# Patient Record
Sex: Female | Born: 1980 | Race: White | Hispanic: No | Marital: Married | Smoking: Never smoker
Health system: Southern US, Community
[De-identification: ages and names within clinical notes are randomized; demographics above are authoritative.]

## PROBLEM LIST (undated history)

## (undated) DIAGNOSIS — F32A Depression, unspecified: Secondary | ICD-10-CM

## (undated) DIAGNOSIS — F329 Major depressive disorder, single episode, unspecified: Secondary | ICD-10-CM

## (undated) HISTORY — PX: ABDOMINAL HYSTERECTOMY: SHX81

## (undated) HISTORY — DX: Depression, unspecified: F32.A

## (undated) HISTORY — DX: Major depressive disorder, single episode, unspecified: F32.9

---

## 2006-03-20 ENCOUNTER — Encounter: Payer: Self-pay | Admitting: Family Medicine

## 2006-06-20 ENCOUNTER — Ambulatory Visit: Payer: Self-pay | Admitting: Family Medicine

## 2007-08-12 ENCOUNTER — Encounter: Payer: Self-pay | Admitting: Family Medicine

## 2007-08-12 ENCOUNTER — Ambulatory Visit: Payer: Self-pay | Admitting: Family Medicine

## 2007-08-12 DIAGNOSIS — H9190 Unspecified hearing loss, unspecified ear: Secondary | ICD-10-CM | POA: Insufficient documentation

## 2007-09-04 ENCOUNTER — Encounter: Payer: Self-pay | Admitting: Family Medicine

## 2007-10-01 ENCOUNTER — Ambulatory Visit: Payer: Self-pay | Admitting: Family Medicine

## 2008-04-03 ENCOUNTER — Ambulatory Visit: Payer: Self-pay | Admitting: Family Medicine

## 2008-04-03 DIAGNOSIS — J029 Acute pharyngitis, unspecified: Secondary | ICD-10-CM | POA: Insufficient documentation

## 2008-08-17 ENCOUNTER — Ambulatory Visit: Payer: Self-pay | Admitting: Family Medicine

## 2008-08-17 DIAGNOSIS — M542 Cervicalgia: Secondary | ICD-10-CM | POA: Insufficient documentation

## 2008-08-18 ENCOUNTER — Encounter: Payer: Self-pay | Admitting: Family Medicine

## 2008-08-18 LAB — CONVERTED CEMR LAB
Direct LDL: 95 mg/dL
Eosinophils Absolute: 0.1 10*3/uL (ref 0.0–0.7)
Eosinophils Relative: 1 % (ref 0–5)
HCT: 40.1 % (ref 36.0–46.0)
Hemoglobin: 13.4 g/dL (ref 12.0–15.0)
Lymphs Abs: 2.4 10*3/uL (ref 0.7–4.0)
MCV: 93.5 fL (ref 78.0–100.0)
Monocytes Absolute: 0.4 10*3/uL (ref 0.1–1.0)
Monocytes Relative: 7 % (ref 3–12)
RBC: 4.29 M/uL (ref 3.87–5.11)
WBC: 6.3 10*3/uL (ref 4.0–10.5)

## 2009-01-22 ENCOUNTER — Telehealth: Payer: Self-pay | Admitting: Family Medicine

## 2009-04-09 ENCOUNTER — Ambulatory Visit: Payer: Self-pay | Admitting: Family Medicine

## 2009-04-09 ENCOUNTER — Encounter: Admission: RE | Admit: 2009-04-09 | Discharge: 2009-04-09 | Payer: Self-pay | Admitting: Family Medicine

## 2009-04-09 DIAGNOSIS — M25539 Pain in unspecified wrist: Secondary | ICD-10-CM | POA: Insufficient documentation

## 2009-04-21 ENCOUNTER — Encounter: Payer: Self-pay | Admitting: Family Medicine

## 2010-01-14 ENCOUNTER — Ambulatory Visit: Payer: Self-pay | Admitting: Family Medicine

## 2010-01-14 DIAGNOSIS — F4389 Other reactions to severe stress: Secondary | ICD-10-CM | POA: Insufficient documentation

## 2010-01-14 DIAGNOSIS — H9209 Otalgia, unspecified ear: Secondary | ICD-10-CM | POA: Insufficient documentation

## 2010-01-14 DIAGNOSIS — F438 Other reactions to severe stress: Secondary | ICD-10-CM | POA: Insufficient documentation

## 2010-01-16 LAB — CONVERTED CEMR LAB
AST: 14 units/L (ref 0–37)
Albumin: 4.7 g/dL (ref 3.5–5.2)
BUN: 10 mg/dL (ref 6–23)
Calcium: 9.5 mg/dL (ref 8.4–10.5)
Chloride: 102 meq/L (ref 96–112)
Glucose, Bld: 97 mg/dL (ref 70–99)
HDL: 60 mg/dL (ref >39)
Potassium: 4.5 meq/L (ref 3.5–5.3)
TSH: 2.199 microintl units/mL (ref 0.350–4.500)

## 2010-01-20 ENCOUNTER — Ambulatory Visit: Payer: Self-pay | Admitting: Family Medicine

## 2010-01-20 DIAGNOSIS — B373 Candidiasis of vulva and vagina: Secondary | ICD-10-CM | POA: Insufficient documentation

## 2010-01-21 ENCOUNTER — Encounter: Payer: Self-pay | Admitting: Family Medicine

## 2010-01-21 ENCOUNTER — Encounter (INDEPENDENT_AMBULATORY_CARE_PROVIDER_SITE_OTHER): Payer: Self-pay | Admitting: *Deleted

## 2010-01-21 LAB — CONVERTED CEMR LAB
WBC, Wet Prep HPF POC: NONE SEEN
Yeast Wet Prep HPF POC: NONE SEEN

## 2010-02-07 ENCOUNTER — Ambulatory Visit (HOSPITAL_COMMUNITY): Payer: Self-pay | Admitting: Psychology

## 2010-02-25 ENCOUNTER — Ambulatory Visit (HOSPITAL_COMMUNITY): Payer: Self-pay | Admitting: Psychology

## 2010-03-03 ENCOUNTER — Ambulatory Visit (HOSPITAL_COMMUNITY): Payer: Self-pay | Admitting: Psychiatry

## 2010-03-09 ENCOUNTER — Ambulatory Visit (HOSPITAL_COMMUNITY): Payer: Self-pay | Admitting: Psychology

## 2010-03-16 ENCOUNTER — Ambulatory Visit (HOSPITAL_COMMUNITY): Payer: Self-pay | Admitting: Psychology

## 2010-04-22 ENCOUNTER — Telehealth: Payer: Self-pay | Admitting: Family Medicine

## 2010-04-26 ENCOUNTER — Ambulatory Visit: Payer: Self-pay | Admitting: Family

## 2010-12-20 NOTE — Assessment & Plan Note (Signed)
Summary: Vaginitis, anxiety   Vital Signs:  Patient profile:   30 year old female Height:      63.75 inches Weight:      134 pounds Pulse rate:   96 / minute BP sitting:   107 / 67  (left arm) Cuff size:   regular  Vitals Entered By: Kathlene November (January 20, 2010 9:21 AM) CC: on antibiotic- has yeast infection- took OTC Tioconazole Ointment 6.5%- applied once and swollen labia and very painful to even walk   Primary Care Provider:  Nani Gasser MD  CC:  on antibiotic- has yeast infection- took OTC Tioconazole Ointment 6.5%- applied once and swollen labia and very painful to even walk.  History of Present Illness: on antibiotic- has yeast infection- took OTC Tioconazole Ointment 6.5%- applied once and swollen labia and very painful to even walk. Sxs started yesterday.  Sx got worse by this AM. having vaginal burning and swelling and noticing cottage cheese discharge. No alleviating sxs.    Current Medications (verified): 1)  Mirena 20 Mcg/24hr Iud (Levonorgestrel) 2)  Augmentin 875-125 Mg Tabs (Amoxicillin-Pot Clavulanate) .... Take 1 Tablet By Mouth Two Times A Day For 10 Days  Allergies (verified): No Known Drug Allergies  Comments:  Nurse/Medical Assistant: The patient's medications and allergies were reviewed with the patient and were updated in the Medication and Allergy Lists. Kathlene November (January 20, 2010 9:22 AM)  Physical Exam  General:  Well-developed,well-nourished,in no acute distress; alert,appropriate and cooperative throughout examination Genitalia:  Her labia aver very swollen, pin and red with a thick white discharge.  Skin:  no rashes.   Psych:  Cognition and judgment appear intact. Alert and cooperative with normal attention span and concentration. No apparent delusions, illusions, hallucinations   Impression & Recommendations:  Problem # 1:  CANDIDIASIS, VAGINAL (ICD-112.1) Based on exam it is consistant with yeast vag. Will treat with diflucatn.  Stop otc cream for now. REpeat dose at the end of her ABX course in about 4 days.  Her updated medication list for this problem includes:    Fluconazole 150 Mg Tabs (Fluconazole) .Marland Kitchen... Take 1 tablet by mouth once a day  Orders: T-Wet Prep for Christoper Allegra, [facility] [id number])  Problem # 2:  ANXIETY, SITUATIONAL (ICD-308.3) Would like referral for counseling.  Orders: Psychology Referral (Psychology)  Complete Medication List: 1)  Mirena 20 Mcg/24hr Iud (Levonorgestrel) 2)  Fluconazole 150 Mg Tabs (Fluconazole) .... Take 1 tablet by mouth once a day Prescriptions: FLUCONAZOLE 150 MG TABS (FLUCONAZOLE) Take 1 tablet by mouth once a day  #2 x 0   Entered and Authorized by:   Nani Gasser MD   Signed by:   Nani Gasser MD on 01/20/2010   Method used:   [facility]        [phone][facility] (retail)       [address]       [place], [place]  [address]       Ph: [phone] or [phone]       Fax: [phone]   RxID:   [id number]

## 2010-12-20 NOTE — Assessment & Plan Note (Signed)
Summary: RX --/VGJ   Vital Signs:  Patient profile:   30 year old female Height:      63.75 inches Weight:      132 pounds Pulse rate:   65 / minute BP sitting:   110 / 72  (left arm) Cuff size:   regular  Vitals Entered By: Kathlene November (April 26, 2010 1:12 PM) CC: discuss getting on anxiety med, Depression   Primary Care Provider:  Nani Gasser MD  CC:  discuss getting on anxiety med and Depression.  History of Present Illness: [name] is a 30 year old female who presents today with complaint of anxiety. Notes that anxiety usually occurs prior to travel and is accompanied by nausea, diarrhea, and sweats.  She lost her mother 2 years ago, has two small children.  Has an upcoming trip planned to the beach in less than 1 week.  Generally sleeps well.   She reports + panic attacks.  These happen when she has new social situations.  Depression History:      Positive alarm features for depression include significant weight loss and fatigue (loss of energy).  However, she denies insomnia, impaired concentration (indecisiveness), and recurrent thoughts of death or suicide.  Positive alarm features for a manic disorder include abnormally & persistently irritable mood.  She denies distractibility and excessive buying sprees.        Psychosocial stress factors include the recent death of a loved one.  Risk factors for depression include a personal history of alcoholism.         Problems Prior to Update: 1)  Candidiasis, Vaginal  (ICD-112.1) 2)  Anxiety, Situational  (ICD-308.3) 3)  Routine General Medical Exam@health  Care Facl  (ICD-V70.0) 4)  Otalgia  (ICD-388.70) 5)  Wrist Pain  (ICD-719.43) 6)  Neck Pain, Acute  (ICD-723.1) 7)  Screening For Lipoid Disorders  (ICD-V77.91) 8)  Pharyngitis, Acute  (ICD-462) 9)  Loss, Hearing Nos  (ICD-389.9)  Medications Prior to Update: 1)  Mirena 20 Mcg/24hr Iud (Levonorgestrel) 2)  Fluconazole 150 Mg Tabs (Fluconazole) .... Take 1 Tablet By  Mouth Once A Day 3)  Metrogel-Vaginal 0.75 % Gel (Metronidazole) .Marland Kitchen.. 1 Gram Pv Nightly For 5 Nights  Current Medications (verified): 1)  Mirena 20 Mcg/24hr Iud (Levonorgestrel)  Allergies (verified): No Known Drug Allergies  Comments:  Nurse/Medical Assistant: The patient's medications and allergies were reviewed with the patient and were updated in the Medication and Allergy Lists. Kathlene November (April 26, 2010 1:13 PM)  Physical Exam  General:  Well-developed,well-nourished,in no acute distress; alert,appropriate and cooperative throughout examination Psych:  Oriented X3, memory intact for recent and remote, normally interactive, and good eye contact.  Mildly anxious but pleasant female.   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 Will plan trial of zoloft with as needed klonopin which is to be used before travel and social situations which she knows induce panic attacks.  Patient is to continue with therapy.  Patient was educated on SE's of SSRI's as noted in patient instructions. Plan f/u in 1 month. >15 minutes was spent with patient.  Greater than 50% of this time was spent counseling patient on her anxiety.  Complete Medication List: 1)  Mirena 20 Mcg/24hr Iud (Levonorgestrel) 2)  Zoloft 50 Mg Tabs (Sertraline hcl) .... 1/2 tab by mouth daily x 1 week, then increase to  full tablet once daily on week two 3)  Zoloft 50 Mg Tabs (Sertraline hcl) .... One tablet by mouth daily 4)  Klonopin 0.5 Mg Tabs (Clonazepam) .... One tablet by mouth three times daily as needed for severe anxiety  Patient Instructions: 1)  Please start 1/2 tablet one a day for one week, then increase to a full tablet. 2)  It will likely take several weeks before you will notice improvement. 3)  Side effects of this medicine may include  drowsiness or nausea.  If this becomes an issue for you call for further instructions. 4)  Very rarely people may develop suicidal thoughts when taking these types of medicines- should this happen to you, discontinue medication and go directly to the emergency room. 5)  Please arrange a follow up appointment in 1 month  Prescriptions: KLONOPIN 0.5 MG TABS (CLONAZEPAM) one tablet by mouth three times daily as needed for severe anxiety  #30 x 0   Entered and Authorized by:   Lemont Fillers FNP   Signed by:   Lemont Fillers FNP on 04/26/2010   Method used:   Print then Give to Patient   RxID:   [id number] ZOLOFT 50 MG TABS (SERTRALINE HCL) one tablet by mouth daily  #90 x 0   Entered and Authorized by:   Lemont Fillers FNP   Signed by:   Lemont Fillers FNP on 04/26/2010   Method used:   Electronically to        [facility]* (mail-order)             ,          Ph: [phone]       Fax: [phone]   RxID:   [id number] ZOLOFT 50 MG TABS (SERTRALINE HCL) 1/2 tab by mouth daily x 1 week, then increase to full tablet once daily on week two  #30 x 0   Entered and Authorized by:   Lemont Fillers FNP   Signed by:   Lemont Fillers FNP on 04/26/2010   Method used:   [facility] to        [facility] [phone][facility] (retail)       [address]       [place], [place]  [address]       Ph: [phone] or [phone]       Fax: [phone]   RxID:   [id number]

## 2010-12-20 NOTE — Assessment & Plan Note (Signed)
Summary: CPE, anxiety   Vital Signs:  Patient profile:   30 year old female LMP:     12/24/2009 Height:      63.75 inches Weight:      136 pounds BMI:     23.61 Pulse rate:   62 / minute BP sitting:   108 / 57  (left arm) Cuff size:   regular  Vitals Entered By: Kathlene November (January 14, 2010 8:09 AM) CC: CPE no pap LMP (date): 12/24/2009     Enter LMP: 12/24/2009 Last PAP Date 11/25/2009 Last PAP Result Normal   Primary Care Provider:  Nani Gasser MD  CC:  CPE no pap.  History of Present Illness: 1 month of right ear pain and into her neck.  Has been coming and going.  Will flare for a couple of days then resovles. Throat will hurt occ when this happens. Has been using IBU for pain which does help.  Says her right ear and posterior neck will feel hot to touch and then later that day it will start the hurt and be painful. No hearing change or ear drianage.   Current Medications (verified): 1)  Mirena 20 Mcg/24hr Iud (Levonorgestrel)  Allergies (verified): No Known Drug Allergies  Comments:  Nurse/Medical Assistant: The patient's medications and allergies were reviewed with the patient and were updated in the Medication and Allergy Lists. Kathlene November (January 14, 2010 8:10 AM)  Past History:  Past Surgical History: None  Family History: alcoholism, depression,  HTN, stroke, DM, alcohlism-mother,  Hi chol-father, Lung Ca-GF, Pancreatic Ca- GM Sister with DM  Social History: [employer] Estate [employer] f-freelancing.  Assoc degree.  Married to [place] with 2 child.  Never smoked, no EtOH, no drugs, 1-2 caff/day, exercises regularly.  Review of Systems  The patient denies anorexia, fever, weight loss, weight gain, vision loss, decreased hearing, hoarseness, chest pain, syncope, dyspnea on exertion, peripheral edema, prolonged cough, headaches, hemoptysis, abdominal pain, melena, hematochezia, severe indigestion/heartburn, hematuria, incontinence, genital sores,  muscle weakness, suspicious skin lesions, transient blindness, difficulty walking, depression, unusual weight change, abnormal bleeding, enlarged lymph nodes, and breast masses.         Sister seems to think she has anxiety issues.  Would get diarrhea beefore traveling.  Would feel anxious.  will get panickly if has to be in front of other people.    Physical Exam  General:  Well-developed,well-nourished,in no acute distress; alert,appropriate and cooperative throughout examination Head:  Normocephalic and atraumatic without obvious abnormalities. No apparent alopecia or balding. Eyes:  No corneal or conjunctival inflammation noted. EOMI. Perrla.  Ears:  External ear exam shows no significant lesions or deformities.  Otoscopic examination reveals clear canals, tympanic membranes are intact bilaterally without bulging, retraction, inflammation or discharge. Hearing is grossly normal bilaterally. Nose:  External nasal examination shows no deformity or inflammation. Mouth:  Oral mucosa and oropharynx without lesions or exudates.  Teeth in good repair. Neck:  No deformities, masses, or tenderness noted. Chest Wall:  No deformities, masses, or tenderness noted. Lungs:  Normal respiratory effort, chest expands symmetrically. Lungs are clear to auscultation, no crackles or wheezes. Heart:  Normal rate and regular rhythm. S1 and S2 normal without gallop, murmur, click, rub or other extra sounds. Abdomen:  Bowel sounds positive,abdomen soft and non-tender without masses, organomegaly or hernias noted. Msk:  No deformity or scoliosis noted of thoracic or lumbar spine.   Pulses:  R and L carotid,radial,dorsalis pedis and posterior tibial pulses are full and equal bilaterally Extremities:  No clubbing, cyanosis, edema, or deformity noted with normal full range of motion of all joints.   Neurologic:  No cranial nerve deficits noted. Station and gait are normal. DTRs are symmetrical throughout. Sensory, motor  and coordinative functions appear intact. Skin:  no rashes.   Cervical Nodes:  No lymphadenopathy noted Psych:  Cognition and judgment appear intact. Alert and cooperative with normal attention span and concentration. No apparent delusions, illusions, hallucinations   Impression & Recommendations:  Problem # 1:  ROUTINE GENERAL MEDICAL EXAM@HEALTH  CARE FACL (ICD-V70.0) Screening labs.  Continue regular exercise. Encourage daily calcium.  Has had a pap and breast exam wtih her OB/GYN.    Problem # 2:  OTALGIA (ICD-388.70) Unclear etiology at this time but will treat for OM. fi not better in 10 days then let me know and will refer to ENT for further evaluation.  Her updated medication list for this problem includes:    Augmentin 875-125 Mg Tabs (Amoxicillin-pot clavulanate) .Marland Kitchen... Take 1 tablet by mouth two times a day for 10 days  Problem # 3:  ANXIETY, SITUATIONAL (ICD-308.3) Discussed different treatment. Discussed therapy for anxiety vs daily meds for GAD, vs acute as needed tretament such as when traveling vs betablocker for public speaking(when she gets up in front of her church). Asked her to think about these optins.She is not interested in as needed benzos becuse of her mothers additction hx. Her sisters are on zoloft. Asked her to let me know what she would like to do after has time to think about it.  Orders: T-Comprehensive Metabolic Panel [id number]) T-Lipid Profile [id number]) T-TSH [id number])  Complete Medication List: 1)  Mirena 20 Mcg/24hr Iud (Levonorgestrel) 2)  Augmentin 875-125 Mg Tabs (Amoxicillin-pot clavulanate) .... Take 1 tablet by mouth two times a day for 10 days  Patient Instructions: 1)  Take calcium +vitamin D daily.  Prescriptions: AUGMENTIN 875-125 MG TABS (AMOXICILLIN-POT CLAVULANATE) Take 1 tablet by mouth two times a day for 10 days  #20 x 0   Entered and Authorized by:   Nani Gasser MD   Signed by:   Nani Gasser MD on  01/14/2010   Method used:   Print then Give to Patient   RxID:   [id number]   Last PAP:  Done (03/20/2006 2:21:12 PM) PAP Next Due:  Not Indicated

## 2010-12-20 NOTE — Letter (Signed)
Summary: Primary Care Consult Scheduled Letter  [place] [facility]  [address]   [place], [place] [address]   Phone: [phone]  Fax: [phone]      01/21/2010 MRN: [id number]  Deborah Schmitt [address] [place], [place]  [address]    Dear [name],      We have scheduled an appointment for you.  At the recommendation of Dr.METHENEY , we have scheduled you a consult with Elray Buba, [facility] on February 07, 2010 at [facility] .  Their address [phone] [place] [address] ,[place] . The office phone number is [phone].  If this appointment day and time is not convenient for you, please feel free to call the office of the doctor you are being referred to at the number listed above and reschedule the appointment.     It is important for you to keep your scheduled appointments. We are here to make sure you are given good patient care. If you have questions or you have made changes to your appointment, please notify us at  [phone], ask for HELEN.    Thank you,  Patient Care Coordinator [place] [facility]

## 2010-12-20 NOTE — Progress Notes (Signed)
Summary: Anxiety med  Phone Note Call from Patient Call back at Home Phone [phone] Call back at [phone]   Caller: Patient Call For: Annaliyah Willig Summary of Call: Pt states her and Dr. Linford Arnold discussed at last office visit putting her on something for anxiety said they talked about Xanax or a beta blocker?Marland Kitchen [facility] [address]. Pt states doesn't really want something that is going to completely knock her out Initial call taken by: Kathlene November,  April 22, 2010 1:04 PM  Follow-up for Phone Call        The last OV note from March says that she referred her to counseling.  Will need OV to f/u if she wants new start RX meds. Follow-up by: Seymour Bars DO,  April 22, 2010 1:05 PM     Appended Document: Anxiety med Pt notified of above instructions and sent to schedule appointment. KJ LPN

## 2012-03-04 ENCOUNTER — Encounter: Payer: Self-pay | Admitting: *Deleted

## 2012-03-05 ENCOUNTER — Encounter: Payer: Self-pay | Admitting: Family Medicine

## 2012-03-05 ENCOUNTER — Ambulatory Visit (INDEPENDENT_AMBULATORY_CARE_PROVIDER_SITE_OTHER): Payer: BC Managed Care – PPO | Admitting: Family Medicine

## 2012-03-05 VITALS — BP 117/68 | HR 86 | Temp 98.1°F | Ht 65.0 in | Wt 129.0 lb

## 2012-03-05 DIAGNOSIS — F458 Other somatoform disorders: Secondary | ICD-10-CM

## 2012-03-05 DIAGNOSIS — R198 Other specified symptoms and signs involving the digestive system and abdomen: Secondary | ICD-10-CM

## 2012-03-05 DIAGNOSIS — Z23 Encounter for immunization: Secondary | ICD-10-CM

## 2012-03-05 DIAGNOSIS — K219 Gastro-esophageal reflux disease without esophagitis: Secondary | ICD-10-CM

## 2012-03-05 DIAGNOSIS — J309 Allergic rhinitis, unspecified: Secondary | ICD-10-CM

## 2012-03-05 DIAGNOSIS — R09A2 Foreign body sensation, throat: Secondary | ICD-10-CM

## 2012-03-05 DIAGNOSIS — R0989 Other specified symptoms and signs involving the circulatory and respiratory systems: Secondary | ICD-10-CM

## 2012-03-05 MED ORDER — OMEPRAZOLE 40 MG PO CPDR: 40.0000 mg | DELAYED_RELEASE_CAPSULE | Freq: Every day | ORAL | Status: DC

## 2012-03-05 NOTE — Patient Instructions (Signed)
Diet for GERD or PUD Nutrition therapy can help ease the discomfort of gastroesophageal reflux disease (GERD) and peptic ulcer disease (PUD).  HOME CARE INSTRUCTIONS   Eat your meals slowly, in a relaxed setting.   Eat 5 to 6 small meals per day.   If a food causes distress, stop eating it for a period of time.  FOODS TO AVOID  Coffee, regular or decaffeinated.   Cola beverages, regular or low calorie.   Tea, regular or decaffeinated.   Pepper.   Cocoa.   High fat foods, including meats.   Butter, margarine, hydrogenated oil (trans fats).   Peppermint or spearmint (if you have GERD).   Fruits and vegetables if not tolerated.   Alcohol.   Nicotine (smoking or chewing). This is one of the most potent stimulants to acid production in the gastrointestinal tract.   Any food that seems to aggravate your condition.  If you have questions regarding your diet, ask your caregiver or a registered dietitian. TIPS  Lying flat may make symptoms worse. Keep the head of your bed raised 6 to 9 inches (15 to 23 cm) by using a foam wedge or blocks under the legs of the bed.   Do not lay down until 3 hours after eating a meal.   Daily physical activity may help reduce symptoms.  MAKE SURE YOU:   Understand these instructions.   Will watch your condition.   Will get help right away if you are not doing well or get worse.  Document Released: 11/06/2005 Document Revised: 10/26/2011 Document Reviewed: 09/22/2011 ExitCare Patient Information 2012 [employer]. 

## 2012-03-05 NOTE — Progress Notes (Signed)
Subjective:    Patient ID: Deborah Schmitt, female    DOB: 1981/03/11, 31 y.o.   MRN: [id number]  HPI Started a year ago. Says would feel like she was getting sick and her tonsils were swollen but says now wonders if her reflux is causing it. Says will feel like something is stuck in her throat. Can happen aobut 30 min after eating but not while eating.  No Nausea or vomiting. No abdominal pain or diarrhea.  No fevever. No thyroid problems.  Has normal thyroid labs recently.  In the last 2 weeks happening every day sometimes twice a day. Can last up to an hour. Has also had daily HA since pllen started a couple of weeks ago.  Has been taking Goody powders.   Review of Systems BP 117/68  Pulse 86  Temp(Src) 98.1 F (36.7 C) (Other (Comment))  Ht 5\' 5"  (1.651 m)  Wt 129 lb (58.514 kg)  BMI 21.47 kg/m2  SpO2 100%    No Known Allergies  Past Medical History  Diagnosis Date  . Depression     No past surgical history on file.  History   Social History  . Marital Status: Married    Spouse Name: N/A    Number of Children: N/A  . Years of Education: N/A   Occupational History  . Not on file.   Social History Main Topics  . Smoking status: Never Smoker   . Smokeless tobacco: Not on file  . Alcohol Use: No  . Drug Use: No  . Sexually Active:    Other Topics Concern  . Not on file   Social History Narrative  . No narrative on file    Family History  Problem Relation Age of Onset  . Hypertension Mother   . Stroke Mother   . Diabetes Mother   . Alcohol abuse Mother   . Hyperlipidemia Father   . Diabetes Sister   . Cancer Maternal Grandmother     pancreatic  . Cancer Maternal Grandfather     lung    Outpatient Encounter Prescriptions as of 03/05/2012  Medication Sig Dispense Refill  . levonorgestrel (MIRENA) 20 MCG/24HR IUD 1 each by Intrauterine route once.      Marland Kitchen omeprazole (PRILOSEC) 40 MG capsule Take 1 capsule (40 mg total) by mouth daily.  30 capsule  3  .  DISCONTD: clonazePAM (KLONOPIN) 0.5 MG tablet Take 0.5 mg by mouth 3 (three) times daily as needed.      Marland Kitchen DISCONTD: sertraline (ZOLOFT) 50 MG tablet Take 50 mg by mouth daily.            Objective:   Physical Exam  Constitutional: She is oriented to person, place, and time. She appears well-developed and well-nourished.  HENT:  Head: Normocephalic and atraumatic.  Right Ear: External ear normal.  Left Ear: External ear normal.  Nose: Nose normal.  Mouth/Throat: Oropharynx is clear and moist.       TMs and canals are clear.   Eyes: Conjunctivae and EOM are normal. Pupils are equal, round, and reactive to light.  Neck: Neck supple. No thyromegaly present.  Cardiovascular: Normal rate, regular rhythm and normal heart sounds.   Pulmonary/Chest: Effort normal and breath sounds normal. She has no wheezes.  Lymphadenopathy:    She has no cervical adenopathy.  Neurological: She is alert and oriented to person, place, and time.  Skin: Skin is warm and dry.  Psychiatric: She has a normal mood and affect.  Assessment & Plan:  Globus sensation - Likely reflux related. Discussed stopping the goodies powders and discussed dietary changes and start omeprazole 40mg  daily.  Call if not sig better in 2 weeks. Will refer to GI if not improving.    AR- Recommend trial of claritin or zyrtec or Allegra. Consider adding D for her HA and sinus pressure and cna use Tylenol instead of an NSAID if needed for HA. She has no contra indications to decongestant. Her blood pressures well controlled.

## 2012-04-01 ENCOUNTER — Telehealth: Payer: Self-pay | Admitting: *Deleted

## 2012-04-01 DIAGNOSIS — K219 Gastro-esophageal reflux disease without esophagitis: Secondary | ICD-10-CM

## 2012-04-01 NOTE — Telephone Encounter (Signed)
Pt was placed on Prevacid about a month ago. States that it worked in the beginning but it doesn't seem to be working now. Would like to know what else she should do? States she has 2 pills left and if you want her to pick up the next rx and take it. Please advise.

## 2012-04-02 NOTE — Telephone Encounter (Signed)
Pt informed of referral and informed to take prevacid BID. Pt will call when she needs a refill.

## 2012-04-02 NOTE — Telephone Encounter (Signed)
Then I recommend GI referral for further evaluation. For now increase prevacid to bid. Can send new Rx. I will put in order for referral.

## 2012-04-19 ENCOUNTER — Other Ambulatory Visit: Payer: Self-pay | Admitting: *Deleted

## 2012-04-19 MED ORDER — OMEPRAZOLE 40 MG PO CPDR: 40.0000 mg | DELAYED_RELEASE_CAPSULE | Freq: Every day | ORAL | Status: DC

## 2012-04-19 MED ORDER — OMEPRAZOLE 40 MG PO CPDR: 40.0000 mg | DELAYED_RELEASE_CAPSULE | Freq: Two times a day (BID) | ORAL | Status: DC

## 2012-05-02 ENCOUNTER — Encounter: Payer: Self-pay | Admitting: *Deleted

## 2012-05-10 ENCOUNTER — Ambulatory Visit: Payer: BC Managed Care – PPO | Admitting: Family Medicine

## 2012-05-27 ENCOUNTER — Encounter: Payer: Self-pay | Admitting: *Deleted

## 2013-04-18 ENCOUNTER — Ambulatory Visit (INDEPENDENT_AMBULATORY_CARE_PROVIDER_SITE_OTHER): Payer: BC Managed Care – PPO | Admitting: Family Medicine

## 2013-04-18 ENCOUNTER — Encounter: Payer: Self-pay | Admitting: Family Medicine

## 2013-04-18 VITALS — BP 93/56 | HR 76 | Wt 127.0 lb

## 2013-04-18 DIAGNOSIS — R0789 Other chest pain: Secondary | ICD-10-CM

## 2013-04-18 DIAGNOSIS — R071 Chest pain on breathing: Secondary | ICD-10-CM

## 2013-04-18 DIAGNOSIS — R7301 Impaired fasting glucose: Secondary | ICD-10-CM

## 2013-04-18 NOTE — Patient Instructions (Addendum)
Call if not better in one week.  

## 2013-04-18 NOTE — Progress Notes (Signed)
  Subjective:    Patient ID: Deborah Schmitt, female    DOB: 1981-09-11, 32 y.o.   MRN: [id number]  HPI Woke up Monday Am and had diarrhea for 3-4 hours and then resolves. That night when went to bed she had pain in the left side of chest, left shoulder.  Then called her next AM and called and told to start an antacid. Tried Mylanta. Then yesterday was worse and went to [facility].  Told had a heart murmur and then referred to ED here in [place]. Had elevated D-dimer but neg Chest CT. Neg cardiac enzymes.  D/C home and took IBU nad did feel a little better last night.  Last night ws woken up with sharp pains and had to prop herself up. Couldn't lay on her back. Worse when she takes a deep breath.  Pain is 2/10.  Worst was 6-7/10.  No fever or cough or trauma. No nausea. She reports normal range of motion of the left shoulder. She says when it's really hurting in her chest she says it does hold but more sore to move her shoulder but otherwise it doesn't bother her to move her shoulder.  Review of Systems     Objective:   Physical Exam  Constitutional: She is oriented to person, place, and time. She appears well-developed and well-nourished.  HENT:  Head: Normocephalic and atraumatic.  Eyes: Conjunctivae are normal. Pupils are equal, round, and reactive to light.  Neck: Neck supple. No thyromegaly present.  Cardiovascular: Normal rate, regular rhythm and normal heart sounds.   Pulmonary/Chest: Effort normal and breath sounds normal.  Musculoskeletal: She exhibits no edema.  Lymphadenopathy:    She has no cervical adenopathy.  Neurological: She is alert and oriented to person, place, and time.  Skin: Skin is warm and dry.  Psychiatric: She has a normal mood and affect. Her behavior is normal.    Nontender under the axilla or the chest wall. When she points to the areas of discomfort it is just below the left axilla, just lateral to the breast and then it radiates up towards the clavicle and  around the shoulder towards her shoulder blade. She says her actual shoulder doesn't hurt it just feels weak.      Assessment & Plan:  Left-sided chest pain still unclear etiology. I explained to her that at least the bad things are ruled out such as a pulmonary embolism, heart attack, pneumothorax, mass, and infection of the chest. This mostly leaves the muscle skeletal wall which is probably why she does get some relief with ibuprofen. Make sure to take it with food and water and stop immediately if any GI discomfort or upset. If she wants to try the Nexium that she is Re: bought she certainly can. If not providing any relief for help within one to 2 weeks then stop it.  Abnormal glucose - will check A1C + hx of gestational diabetes. A1c is 5.5 which is completely normal. Gave her reassurance.  Emergency department notes were reviewed.. CBC, CMP were normal. CT and chest x-ray were normal.

## 2013-05-07 ENCOUNTER — Encounter: Payer: Self-pay | Admitting: Family Medicine

## 2014-03-27 ENCOUNTER — Ambulatory Visit: Payer: BC Managed Care – PPO | Admitting: Sports Medicine

## 2014-05-07 ENCOUNTER — Encounter: Payer: Self-pay | Admitting: Family Medicine

## 2014-05-07 ENCOUNTER — Ambulatory Visit (INDEPENDENT_AMBULATORY_CARE_PROVIDER_SITE_OTHER): Payer: BC Managed Care – PPO | Admitting: Family Medicine

## 2014-05-07 VITALS — BP 108/68 | HR 76 | Ht 63.6 in | Wt 119.0 lb

## 2014-05-07 DIAGNOSIS — M79609 Pain in unspecified limb: Secondary | ICD-10-CM

## 2014-05-07 DIAGNOSIS — M79662 Pain in left lower leg: Secondary | ICD-10-CM

## 2014-05-07 DIAGNOSIS — R252 Cramp and spasm: Secondary | ICD-10-CM

## 2014-05-07 DIAGNOSIS — M79605 Pain in left leg: Secondary | ICD-10-CM

## 2014-05-07 LAB — CBC
HEMATOCRIT: 41.1 % (ref 36.0–46.0)
HEMOGLOBIN: 14.4 g/dL (ref 12.0–15.0)
MCH: 31.5 pg (ref 26.0–34.0)
MCHC: 35 g/dL (ref 30.0–36.0)
MCV: 89.9 fL (ref 78.0–100.0)
Platelets: 206 10*3/uL (ref 150–400)
RBC: 4.57 MIL/uL (ref 3.87–5.11)
RDW: 12.9 % (ref 11.5–15.5)
WBC: 4.1 10*3/uL (ref 4.0–10.5)

## 2014-05-07 LAB — SEDIMENTATION RATE: SED RATE: 1 mm/h (ref 0–22)

## 2014-05-07 MED ORDER — LEVONORG-ETH ESTRAD TRIPHASIC PO TABS: 1.0000 | ORAL_TABLET | Freq: Every day | ORAL | Status: DC

## 2014-05-07 NOTE — Progress Notes (Signed)
Subjective:    Patient ID: Deborah Schmitt, female    DOB: [birth year], 33 y.o.   MRN: [id number]  HPI Cramping since started birth control last November.  Seh is on Gildess Fe. Cramping is usually at night. She just has to stretch out and eventually will resolve and improve. No known triggers. Feels like hydrated well. No regular exercise. A couple of weeks ago was getting a cold shooting sensation in left leg. Felt cold and tingling.  Even foot feel like pins and needles.  Now feels like a constant pressure in her calf, a most like someone is pressing on it for the last 2 weeks. Feels like going through the middle of the leg. It is not localized to the side back or anterior portion of the thigh or leg. She says it starts at the hip and is always down to her foot. Occasionally she'll notice that her heel is non-. Sometimes it's her arch. She says even right now her heel feels a little numb and tingly. No back pain , or recent injury. She does stand a lot, oso has tried to sit more, but that has not improved her symptoms.     Review of Systems No chest pain or shortness of breath. No fevers chills or sweats. No prior history DVT. She is on birth control.  BP 108/68  Pulse 76  Ht 5' 3.6" (1.615 m)  Wt 119 lb (53.978 kg)  BMI 20.70 kg/m2    No Known Allergies  Past Medical History  Diagnosis Date  . Depression     No past surgical history on file.  History   Social History  . Marital Status: Married    Spouse Name: N/A    Number of Children: N/A  . Years of Education: N/A   Occupational History  . Not on file.   Social History Main Topics  . Smoking status: Never Smoker   . Smokeless tobacco: Not on file  . Alcohol Use: No  . Drug Use: No  . Sexual Activity:    Other Topics Concern  . Not on file   Social History Narrative  . No narrative on file    Family History  Problem Relation Age of Onset  . Hypertension Mother   . Stroke Mother   . Diabetes Mother   .  Alcohol abuse Mother   . Hyperlipidemia Father   . Diabetes Sister   . Cancer Maternal Grandmother     pancreatic  . Cancer Maternal Grandfather     lung    Outpatient Encounter Prescriptions as of 05/07/2014  Medication Sig  . GILDESS FE 1.5/30 1.5-30 MG-MCG tablet   . levonorgestrel-ethinyl estradiol (TRIVORA, 28,) tablet Take 1 tablet by mouth daily.  Marland Kitchen omeprazole (PRILOSEC) 40 MG capsule Take 1 capsule (40 mg total) by mouth 2 (two) times daily.  . [DISCONTINUED] levonorgestrel (MIRENA) 20 MCG/24HR IUD 1 each by Intrauterine route once.          Objective:   Physical Exam  Constitutional: She is oriented to person, place, and time. She appears well-developed and well-nourished.  HENT:  Head: Normocephalic and atraumatic.  Eyes: Conjunctivae are normal.  Cardiovascular: Normal rate.   Musculoskeletal:  Lumbar spine with normal flexion, extension, rotation right and left, side bending. Hip, knee, ankle strength is 5 out of 5 bilaterally. No lower extremity edema. No tenderness over the calf with squeezing.  Neurological: She is alert and oriented to person, place, and time. She has normal  reflexes.  Skin: Skin is warm and dry.  Psychiatric: She has a normal mood and affect. Her behavior is normal.          Assessment & Plan:  Muscle cramping - will check CK, thyroid etc. We'll go ahead and change her birth control to a different type of estrogen component just to see if this makes a difference. She does correlate cramping specifically with taking the birth control.  Left leg pain - the leg pain is diffuse and feels addicted to the center of the leg which is not consistent with a single nerve injury. She's had some significant pressure over the calf muscle that has been persistent for the last 2 weeks. She's not had any pain with squeezing of the calf today but I will go ahead and check a d-dimer but I think this is low risk overall, for DVT. She does have numbness  specifically over the heel the symptoms in the arch which could be related to an injury to S1. There she denies any back pain whatsoever. Consider nerve conduction testing if her blood work is normal.

## 2014-05-07 NOTE — Patient Instructions (Signed)
We can consider nerve conduction study of the left leg if the labs are normal.

## 2014-05-08 ENCOUNTER — Ambulatory Visit (HOSPITAL_BASED_OUTPATIENT_CLINIC_OR_DEPARTMENT_OTHER)
Admission: RE | Admit: 2014-05-08 | Discharge: 2014-05-08 | Disposition: A | Discharge status: Home / Self Care | Payer: BC Managed Care – PPO | Source: Ambulatory Visit | Attending: Family Medicine | Admitting: Family Medicine

## 2014-05-08 ENCOUNTER — Other Ambulatory Visit: Payer: Self-pay | Admitting: Family Medicine

## 2014-05-08 DIAGNOSIS — R7989 Other specified abnormal findings of blood chemistry: Secondary | ICD-10-CM

## 2014-05-08 DIAGNOSIS — M79662 Pain in left lower leg: Secondary | ICD-10-CM

## 2014-05-08 DIAGNOSIS — M79609 Pain in unspecified limb: Secondary | ICD-10-CM | POA: Insufficient documentation

## 2014-05-08 DIAGNOSIS — R791 Abnormal coagulation profile: Secondary | ICD-10-CM | POA: Insufficient documentation

## 2014-05-08 LAB — MAGNESIUM: MAGNESIUM: 2.1 mg/dL (ref 1.5–2.5)

## 2014-05-08 LAB — TSH: TSH: 2.055 u[IU]/mL (ref 0.350–4.500)

## 2014-05-08 LAB — FERRITIN: FERRITIN: 30 ng/mL (ref 10–291)

## 2014-05-08 LAB — D-DIMER, QUANTITATIVE (NOT AT ARMC): D DIMER QUANT: 1.07 ug{FEU}/mL — AB (ref 0.00–0.48)

## 2014-05-08 LAB — VITAMIN B12: Vitamin B-12: 386 pg/mL (ref 211–911)

## 2014-05-08 LAB — CK: CK TOTAL: 65 U/L (ref 7–177)

## 2014-09-04 ENCOUNTER — Other Ambulatory Visit: Payer: Self-pay

## 2015-09-17 ENCOUNTER — Ambulatory Visit (INDEPENDENT_AMBULATORY_CARE_PROVIDER_SITE_OTHER): Payer: BLUE CROSS/BLUE SHIELD | Admitting: Family Medicine

## 2015-09-17 ENCOUNTER — Encounter: Payer: Self-pay | Admitting: Family Medicine

## 2015-09-17 VITALS — BP 118/70 | HR 69 | Temp 98.2°F | Resp 16 | Wt 143.6 lb

## 2015-09-17 DIAGNOSIS — M25511 Pain in right shoulder: Secondary | ICD-10-CM | POA: Diagnosis not present

## 2015-09-17 DIAGNOSIS — R21 Rash and other nonspecific skin eruption: Secondary | ICD-10-CM | POA: Diagnosis not present

## 2015-09-17 MED ORDER — PREDNISONE 10 MG PO TABS: ORAL_TABLET | ORAL | Status: DC

## 2015-09-17 NOTE — Addendum Note (Signed)
Addended by: Teddy Spike on: 09/17/2015 01:50 PM   Modules accepted: Orders

## 2015-09-17 NOTE — Progress Notes (Signed)
   Subjective:    Patient ID: Deborah Schmitt, female    DOB: 26-Aug-1981, 34 y.o.   MRN: [id number]  HPI Noticed rash on face on right side of jaw line about 2 weeks ago. Started using ketoconazole. Says started at a bump and has been getting large. Now looks like a ring.  + very itchy.  Then yesterday woke up and rash is now on neck and forehead and right temple. They are more scaly bumps and itching as well. She did get hair extensions 3 weeks ago. No other new soaps, lotions etc. No hiking or camping. She does have a cat.  She's also had pain in her right shoulder since August. She says she is right-handed and that her dominant arm. She does do a lot of heavy lifting at work. But doesn't remember a specific injury or trauma to the shoulder. She's never had problems before. She says it's painful to raise her arm through about 30-90. When she gets past 90 she is able to reach above her head. She just feels like it's weaker at times. She does take ibuprofen for it when it is really bothering her when she has to work. Painful to sleep on that shoulder at times. No real alleviating factors    Review of Systems     Objective:   Physical Exam  Constitutional: She appears well-developed and well-nourished.  Musculoskeletal:  Right shoulder with normal range of motion. She did have slight weakness on the right with empty can test compared to the left. She also had difficulty with lift-off test and her right shoulder compared to her left shoulder. No significant discomfort with internal or external rotation of the shoulder. Strength is 5 out of 5.  Neurological:  She does have a ring shaped rash on the right jawline with some central clearing. There is a little bit of scale on the surface. She has about 5 more smaller lesions approximately 1 cm in size with no suture clearing on the right side of her neck and a few smaller lesions clustered on her right temple that are slightly raised and pink and some  have a little bit of scale but not all of them. She also has a couple of lesions on the left side of the neck.  Skin: Skin is warm and dry. Rash noted.  Psychiatric: She has a normal mood and affect. Her behavior is normal.          Assessment & Plan:  Rash-the most seems to be more allergic. If she's been using ketoconazole on it for almost 2 weeks and it has actually been getting worse and I doubt that it's fungal. Skin scraping performed and will send for KOH evaluation. Recommend a trial of prednisone. It actually looks very much like poison ivy or contact dermatitis. Call with results once available. If she doesn't improve on the prednisone then please let us know. Next  Right shoulder pain-most consistent with rotator cuff tendinitis versus partial tear. She did have some weakness with empty can test and some weakness with liftoff test of her low back. Given handout on some home stretches and exercises to do. Can use ibuprofen as needed. Call if not at least some improvement over the next 3-4 weeks.

## 2015-09-21 LAB — KOH PREP: RESULT - KOH: NONE SEEN

## 2016-05-16 ENCOUNTER — Encounter: Payer: Self-pay | Admitting: Osteopathic Medicine

## 2016-05-16 ENCOUNTER — Ambulatory Visit (INDEPENDENT_AMBULATORY_CARE_PROVIDER_SITE_OTHER): Payer: BLUE CROSS/BLUE SHIELD | Admitting: Osteopathic Medicine

## 2016-05-16 VITALS — BP 119/78 | HR 65 | Ht 64.0 in | Wt 143.0 lb

## 2016-05-16 DIAGNOSIS — H6981 Other specified disorders of Eustachian tube, right ear: Secondary | ICD-10-CM

## 2016-05-16 DIAGNOSIS — H81311 Aural vertigo, right ear: Secondary | ICD-10-CM | POA: Diagnosis not present

## 2016-05-16 MED ORDER — IPRATROPIUM BROMIDE 0.03 % NA SOLN: 2.0000 | Freq: Two times a day (BID) | NASAL | Status: DC

## 2016-05-16 NOTE — Progress Notes (Signed)
HPI: Deborah Schmitt is a 35 y.o. female who presents to [facility] today for chief complaint of:  Chief Complaint  Patient presents with  . Ear Pain    RIGHT    . Location: right ear  Context: 4 days ago was swimming, diving, hasn't done this in some time. Doesn't remember any water going into her nose or having your problems immediately after swimming. Seen in urgent care for ear pain/dizziness the next day, see below for medications. History of vertigo several years ago. . Quality: room spinning and ear pain on R, ear now is feeling warm and aching . Severity: getting better but not really going away. Seems to be happening intermittently . Modifying factors: Was given prednisone printed Rx and is taking meclizine  . Assoc signs/symptoms:  No fever, chills, headache, vision changes. No presyncope.   Past medical, social and family history reviewed: Past Medical History  Diagnosis Date  . Depression    No past surgical history on file. Social History  Substance Use Topics  . Smoking status: Never Smoker   . Smokeless tobacco: Not on file  . Alcohol Use: No   Family History  Problem Relation Age of Onset  . Hypertension Mother   . Stroke Mother   . Diabetes Mother   . Alcohol abuse Mother   . Hyperlipidemia Father   . Diabetes Sister   . Cancer Maternal Grandmother     pancreatic  . Cancer Maternal Grandfather     lung    Current Outpatient Prescriptions  Medication Sig Dispense Refill  . meclizine (ANTIVERT) 25 MG tablet Take by mouth.     No current facility-administered medications for this visit.   No Known Allergies    Review of Systems: CONSTITUTIONAL:  No  fever, no chills, No recent illness, No unintentional weight changes HEAD/EYES/EARS/NOSE/THROAT: No  headache, no vision change, no hearing change, No sore throat, No  sinus pressure, (+) ear symptoms as per history of present illness CARDIAC: No  chest pain, No   pressure, No palpitations, No  orthopnea RESPIRATORY: No  cough, No  shortness of breath/wheeze GASTROINTESTINAL: No  nausea, No  vomiting, No  abdominal pain NEUROLOGIC: No  weakness, (+) dizziness, No  slurred speech, no headaches, no presyncope   Exam:  BP 119/78 mmHg  Pulse 65  Ht 5\' 4"  (1.626 m)  Wt 143 lb (64.864 kg)  BMI 24.53 kg/m2 Constitutional: VS see above. General Appearance: alert, well-developed, well-nourished, NAD Eyes: Normal lids and conjunctive, non-icteric sclera, PERRLA, EOMI Ears, Nose, Mouth, Throat: MMM, Normal external inspection ears/nares/mouth/lips/gums, TM normal bilaterally, scant clear effusion behind right ear. Pharynx/tonsils no erythema, no exudate. Nasal mucosa normal.                                                    Neck: No masses, trachea midline. No thyroid enlargement. No tenderness/mass appreciated. No lymphadenopathy, normal range of motion to neck Respiratory: Normal respiratory effort. no wheeze, no rhonchi, no rales Cardiovascular: S1/S2 normal, no murmur, no rub/gallop auscultated. RRR. No lower extremity edema. Neurological: No cranial nerve deficit on limited exam. Motor and sensation intact and symmetric. Cerebellar reflexes intact. Hallpike maneuver negative bilaterally. Gait ormal Skin: warm, dry, intact. No rash/ulcer. No concerning nevi or subq nodules on limited exam.   Psychiatric: Normal judgment/insight.  Normal mood and affect. Oriented x3.      ASSESSMENT/PLAN: Most likely vertigo, could've been exacerbated by unusual positioning/activity during swimming and diving. Dix Hallpike maneuver was negative, patient states symptoms are overall better but haven't quite resolved, continue to monitor, treat as below, will add ipratropium to hopefully clear her nasal passages if eustachian tube dysfunction is playing a role here. If not getting better, would consider labs, no referral, MRI / follow-up with PCP  Eustachian tube dysfunction,  right - Plan: ipratropium (ATROVENT) 0.03 % nasal spray  Vertigo, aural, right    All questions were answered. Visit summary with medication list and pertinent instructions was printed for patient to review. ER/RTC precautions were reviewed with the patient. Return if symptoms worsen or fail to improve.

## 2016-05-16 NOTE — Patient Instructions (Signed)
Barotitis Media Barotitis media is inflammation of your middle ear. This occurs when the auditory tube (eustachian tube) leading from the back of your nose (nasopharynx) to your eardrum is blocked. This blockage may result from a cold, environmental allergies, or an upper respiratory infection, or other causes of inflammation in the sinuses.  HOME CARE INSTRUCTIONS   Use medicines as recommended by your health care provider. Over-the-counter antihistamines will help unblock the canal and can help during times of air travel.  Do not put anything into your ears to clean or unplug them. Eardrops will not be helpful.  Do not swim, dive, or fly until your health care provider says it is all right to do so. If these activities are necessary, chewing gum with frequent, forceful swallowing may help. It is also helpful to hold your nose and gently blow to pop your ears for equalizing pressure changes. This forces air into the eustachian tube.  Only take over-the-counter or prescription medicines for pain, discomfort, or fever as directed by your health care provider.  A decongestant or an antihistamine nasal spray may be helpful in decongesting the middle ear and make pressure equalization easier. SEEK MEDICAL CARE IF:  You experience a serious form of dizziness in which you feel as if the room is spinning and you feel nauseated (vertigo).  Your symptoms only involve one ear. SEEK IMMEDIATE MEDICAL CARE IF:   You develop a severe headache, dizziness, or severe ear pain.  You have bloody or pus-like drainage from your ears.  You develop a fever.  Your problems do not improve within 7 days or if they become worse. MAKE SURE YOU:   Understand these instructions.  Will watch your condition.  Will get help right away if you are not doing well or get worse.   This information is not intended to replace advice given to you by your health care provider. Make sure you discuss any questions you have  with your health care provider.   Document Released: 11/03/2000 Document Revised: 08/27/2013 Document Reviewed: 06/03/2013 Elsevier Interactive Patient Education [employer].

## 2016-11-28 DIAGNOSIS — N83209 Unspecified ovarian cyst, unspecified side: Secondary | ICD-10-CM | POA: Insufficient documentation

## 2016-11-28 HISTORY — PX: TOTAL VAGINAL HYSTERECTOMY: SHX2548

## 2016-11-30 DIAGNOSIS — Z124 Encounter for screening for malignant neoplasm of cervix: Secondary | ICD-10-CM | POA: Insufficient documentation

## 2016-12-15 ENCOUNTER — Ambulatory Visit: Payer: BLUE CROSS/BLUE SHIELD | Admitting: Family Medicine

## 2017-09-20 DIAGNOSIS — R109 Unspecified abdominal pain: Secondary | ICD-10-CM | POA: Diagnosis not present

## 2017-09-20 DIAGNOSIS — Z90710 Acquired absence of both cervix and uterus: Secondary | ICD-10-CM | POA: Diagnosis not present

## 2017-10-02 DIAGNOSIS — R109 Unspecified abdominal pain: Secondary | ICD-10-CM | POA: Diagnosis not present

## 2018-01-02 DIAGNOSIS — Z23 Encounter for immunization: Secondary | ICD-10-CM | POA: Diagnosis not present

## 2018-01-02 DIAGNOSIS — J0100 Acute maxillary sinusitis, unspecified: Secondary | ICD-10-CM | POA: Diagnosis not present

## 2018-03-28 DIAGNOSIS — J328 Other chronic sinusitis: Secondary | ICD-10-CM | POA: Diagnosis not present

## 2018-03-28 DIAGNOSIS — H938X3 Other specified disorders of ear, bilateral: Secondary | ICD-10-CM | POA: Diagnosis not present

## 2018-03-28 DIAGNOSIS — R42 Dizziness and giddiness: Secondary | ICD-10-CM | POA: Diagnosis not present

## 2018-03-28 DIAGNOSIS — H9041 Sensorineural hearing loss, unilateral, right ear, with unrestricted hearing on the contralateral side: Secondary | ICD-10-CM | POA: Diagnosis not present

## 2018-03-28 DIAGNOSIS — R51 Headache: Secondary | ICD-10-CM | POA: Diagnosis not present

## 2018-03-28 DIAGNOSIS — H93293 Other abnormal auditory perceptions, bilateral: Secondary | ICD-10-CM | POA: Diagnosis not present

## 2018-04-02 DIAGNOSIS — G43709 Chronic migraine without aura, not intractable, without status migrainosus: Secondary | ICD-10-CM | POA: Diagnosis not present

## 2018-05-07 DIAGNOSIS — Z6825 Body mass index (BMI) 25.0-25.9, adult: Secondary | ICD-10-CM | POA: Diagnosis not present

## 2018-05-07 DIAGNOSIS — Z01419 Encounter for gynecological examination (general) (routine) without abnormal findings: Secondary | ICD-10-CM | POA: Diagnosis not present

## 2018-05-07 DIAGNOSIS — Z90710 Acquired absence of both cervix and uterus: Secondary | ICD-10-CM | POA: Diagnosis not present

## 2018-05-29 DIAGNOSIS — G43709 Chronic migraine without aura, not intractable, without status migrainosus: Secondary | ICD-10-CM | POA: Insufficient documentation

## 2018-08-13 ENCOUNTER — Encounter: Payer: Self-pay | Admitting: Sports Medicine

## 2018-08-13 ENCOUNTER — Ambulatory Visit (INDEPENDENT_AMBULATORY_CARE_PROVIDER_SITE_OTHER): Payer: BLUE CROSS/BLUE SHIELD

## 2018-08-13 ENCOUNTER — Ambulatory Visit (INDEPENDENT_AMBULATORY_CARE_PROVIDER_SITE_OTHER): Payer: BLUE CROSS/BLUE SHIELD | Admitting: Sports Medicine

## 2018-08-13 DIAGNOSIS — R200 Anesthesia of skin: Secondary | ICD-10-CM | POA: Diagnosis not present

## 2018-08-13 DIAGNOSIS — M25561 Pain in right knee: Secondary | ICD-10-CM | POA: Diagnosis not present

## 2018-08-13 DIAGNOSIS — M545 Low back pain: Secondary | ICD-10-CM

## 2018-08-13 DIAGNOSIS — M2550 Pain in unspecified joint: Secondary | ICD-10-CM | POA: Diagnosis not present

## 2018-08-13 DIAGNOSIS — G5603 Carpal tunnel syndrome, bilateral upper limbs: Secondary | ICD-10-CM | POA: Diagnosis not present

## 2018-08-13 DIAGNOSIS — M79644 Pain in right finger(s): Secondary | ICD-10-CM

## 2018-08-13 DIAGNOSIS — M79604 Pain in right leg: Secondary | ICD-10-CM

## 2018-08-13 DIAGNOSIS — M25562 Pain in left knee: Secondary | ICD-10-CM | POA: Diagnosis not present

## 2018-08-13 DIAGNOSIS — M79642 Pain in left hand: Secondary | ICD-10-CM | POA: Diagnosis not present

## 2018-08-13 DIAGNOSIS — G8929 Other chronic pain: Secondary | ICD-10-CM | POA: Diagnosis not present

## 2018-08-13 MED ORDER — PREDNISONE 50 MG PO TABS: ORAL_TABLET | ORAL | 0 refills | Status: DC

## 2018-08-13 NOTE — Progress Notes (Addendum)
Subjective:    I'm seeing this patient as a consultation for: Dr. 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  Bilateral hand pain: With numbness and tingling into the middle fingers bilaterally, worse at night and in the morning.  Occasional cramping, symptoms are moderate, persistent, no trauma.  Right leg pain: Radiates from her back, down the anterior right knee, to the great toe.  Occasional sensations of swelling in the right knee, swelling in the great toe that come intermittently.  Worse with sitting, flexion, Valsalva.  No bowel or bladder dysfunction, saddle numbness, constitutional symptoms.  Occasionally occurs in the contralateral side as well, moderate to severe morning stiffness.  I reviewed the past medical history, family history, social history, surgical history, and allergies today and no changes were needed.  Please see the problem list section below in epic for further details.  Past Medical History: Past Medical History:  Diagnosis Date  . Depression    Past Surgical History: Past Surgical History:  Procedure Laterality Date  . ABDOMINAL HYSTERECTOMY     Social History: Social History   Socioeconomic History  . Marital status: Married    Spouse name: Not on file  . Number of children: Not on file  . Years of education: Not on file  . Highest education level: Not on file  Occupational History  . Not on file  Social Needs  . Financial resource strain: Not on file  . Food insecurity:    Worry: Not on file    Inability: Not on file  . Transportation needs:    Medical: Not on file    Non-medical: Not on file  Tobacco Use  . Smoking status: Never Smoker  . Smokeless tobacco: Never Used  Substance and Sexual Activity  . Alcohol use: No  . Drug use: No  . Sexual activity: Not  on file  Lifestyle  . Physical activity:    Days per week: Not on file    Minutes per session: Not on file  . Stress: Not on file  Relationships  . Social connections:    Talks on phone: Not on file    Gets together: Not on file    Attends religious service: Not on file    Active member of club or organization: Not on file    Attends meetings of clubs or organizations: Not on file    Relationship status: Not on file  Other Topics Concern  . Not on file  Social History Narrative  . Not on file   Family History: Family History  Problem Relation Age of Onset  . Hypertension Mother   . Stroke Mother   . Diabetes Mother   . Alcohol abuse Mother   . Hyperlipidemia Father   . Diabetes Sister   . Cancer Maternal Grandmother        pancreatic  . Cancer Maternal Grandfather        lung   Allergies: No Known Allergies Medications: See med rec.  Review of Systems: No headache, visual changes, nausea, vomiting, diarrhea, constipation, dizziness, abdominal pain, skin rash, fevers, chills, night sweats, weight loss, swollen lymph nodes, body aches, joint swelling, muscle aches, chest pain, shortness of breath, mood changes, visual or auditory hallucinations.   Objective:   General: Well Developed, well nourished, and in no acute distress.  Neuro:  Extra-ocular muscles  intact, able to move all 4 extremities, sensation grossly intact.  Deep tendon reflexes tested were normal. Psych: Alert and oriented, mood congruent with affect. ENT:  Ears and nose appear unremarkable.  Hearing grossly normal. Neck: Unremarkable overall appearance, trachea midline.  No visible thyroid enlargement. Eyes: Conjunctivae and lids appear unremarkable.  Pupils equal and round. Skin: Warm and dry, no rashes noted.  Cardiovascular: Pulses palpable, no extremity edema. Bilateral wrists: Inspection normal with no visible erythema or swelling. ROM smooth and normal with good flexion and extension and  ulnar/radial deviation that is symmetrical with opposite wrist. Palpation is normal over metacarpals, navicular, lunate, and TFCC; tendons without tenderness/ swelling No snuffbox tenderness. No tenderness over Canal of Gu[place]. Strength 5/5 in all directions without pain. Negative bilateral Tinel sign but a positive bilateral Phalen's sign. Negative Finkelstein sign. Negative Watson's test. No wrist, MCP, PIP or DIP synovitis. Back Exam:  Inspection: Unremarkable  Motion: Flexion 45 deg, Extension 45 deg, Side Bending to 45 deg bilaterally,  Rotation to 45 deg bilaterally  SLR laying: Negative  XSLR laying: Negative  Palpable tenderness: None. FABER: negative. Sensory change: Gross sensation intact to all lumbar and sacral dermatomes.  Reflexes: 2+ at both patellar tendons, 2+ at achilles tendons, Babinski's downgoing.  Strength at foot  Plantar-flexion: 5/5 Dorsi-flexion: 5/5 Eversion: 5/5 Inversion: 5/5  Leg strength  Quad: 5/5 Hamstring: 5/5 Hip flexor: 5/5 Hip abductors: 5/5  Gait unremarkable. Right Knee: Normal to inspection with no erythema or effusion or obvious bony abnormalities. Palpation normal with no warmth or joint line tenderness or patellar tenderness or condyle tenderness. ROM normal in flexion and extension and lower leg rotation. Ligaments with solid consistent endpoints including ACL, PCL, LCL, MCL. Negative Mcmurray's and provocative meniscal tests. Non painful patellar compression. Patellar and quadriceps tendons unremarkable. Hamstring and quadriceps strength is normal. Right Foot: No visible erythema or swelling. Range of motion is full in all directions. Strength is 5/5 in all directions. No hallux valgus. No pes cavus or pes planus. No abnormal callus noted. No pain over the navicular prominence, or base of fifth metatarsal. No tenderness to palpation of the calcaneal insertion of plantar fascia. No pain at the Achilles insertion. No pain over the  calcaneal bursa. No pain of the retrocalcaneal bursa. No tenderness to palpation over the tarsals, metatarsals, or phalanges. No hallux rigidus or limitus. No tenderness palpation over interphalangeal joints. No pain with compression of the metatarsal heads. Neurovascularly intact distally.  Impression and Recommendations:   This case required medical decision making of moderate complexity.  Right leg pain The differential is fairly broad, primary knee processes, foot processes but also L4 radiculitis are all possibilities. Adding x-rays of the knee, lumbar spine. Checking some routine labs. Adding 5 days of prednisone.  Polyarthralgia Significant morning stiffness, polyarthralgia, occasional episodes of swelling in the knee and the great toe, question gout, rheumatoid arthritis. Adding a full rheumatoid work-up. Because of this we do need bilateral hand x-rays to look for the classic periarticular erosions.  Autoimmune workup is normal with the exception of rheumatoid factor, its mildly elevated.  CCP is more specific and is negative.  This means she likely does NOT have rheumatoid arthritis but certainly has some autoimmune disease, which explains her dramatic response to prednisone.  She likely falls in the gray area between normal and true autoimmune disease.  I still think we need the neurology second opinion and I do suspect we may end up using prednisone more often, may even  put her on a low dose daily prednisone with appropriate monitoring.  Carpal tunnel syndrome, bilateral Classic carpal tunnel symptoms with a bilateral positive Phalen's sign. She will do nighttime splinting. Return to see me in 1 month for this, bilateral median nerve hydrodissection under ultrasound guidance if no better. ___________________________________________ Gwen Her. Dianah Field, M.D., ABFM., CAQSM. Primary Care and [place] Instructor of [place] of [facility]

## 2018-08-13 NOTE — Assessment & Plan Note (Signed)
Classic carpal tunnel symptoms with a bilateral positive Phalen's sign. She will do nighttime splinting. Return to see me in 1 month for this, bilateral median nerve hydrodissection under ultrasound guidance if no better.

## 2018-08-13 NOTE — Assessment & Plan Note (Signed)
The differential is fairly broad, primary knee processes, foot processes but also L4 radiculitis are all possibilities. Adding x-rays of the knee, lumbar spine. Checking some routine labs. Adding 5 days of prednisone.

## 2018-08-13 NOTE — Assessment & Plan Note (Addendum)
Significant morning stiffness, polyarthralgia, occasional episodes of swelling in the knee and the great toe, question gout, rheumatoid arthritis. Adding a full rheumatoid work-up. Because of this we do need bilateral hand x-rays to look for the classic periarticular erosions.  Autoimmune workup is normal with the exception of rheumatoid factor, its mildly elevated.  CCP is more specific and is negative.  This means she likely does NOT have rheumatoid arthritis but certainly has some autoimmune disease, which explains her dramatic response to prednisone.  She likely falls in the gray area between normal and true autoimmune disease.  I still think we need the neurology second opinion and I do suspect we may end up using prednisone more often, may even put her on a low dose daily prednisone with appropriate monitoring.

## 2018-08-18 LAB — ANA, IFA COMPREHENSIVE PANEL
Anti Nuclear Antibody(ANA): NEGATIVE
ENA SM Ab Ser-aCnc: <1 AI
SM/RNP: <1 AI
SSA (Ro) (ENA) Antibody, IgG: <1 AI
SSB (La) (ENA) Antibody, IgG: <1 AI
Scleroderma (Scl-70) (ENA) Antibody, IgG: <1 AI
ds DNA Ab: 1 [IU]/mL

## 2018-08-18 LAB — COMPREHENSIVE METABOLIC PANEL
AG Ratio: 1.4 (calc) (ref 1.0–2.5)
ALT: 11 U/L (ref 6–29)
Albumin: 4.5 g/dL (ref 3.6–5.1)
CO2: 27 mmol/L (ref 20–32)
Calcium: 9.6 mg/dL (ref 8.6–10.2)
Chloride: 103 mmol/L (ref 98–110)
Creat: 0.72 mg/dL (ref 0.50–1.10)
Globulin: 3.2 g/dL (calc) (ref 1.9–3.7)
Glucose, Bld: 95 mg/dL (ref 65–139)
Sodium: 139 mmol/L (ref 135–146)
Total Bilirubin: 0.5 mg/dL (ref 0.2–1.2)
Total Protein: 7.7 g/dL (ref 6.1–8.1)

## 2018-08-18 LAB — CBC WITH DIFFERENTIAL/PLATELET
Basophils Absolute: 20 cells/uL (ref 0–200)
Basophils Relative: 0.3 %
Eosinophils Absolute: 39 {cells}/uL (ref 15–500)
Eosinophils Relative: 0.6 %
HCT: 40.1 % (ref 35.0–45.0)
Hemoglobin: 13.9 g/dL (ref 11.7–15.5)
Lymphs Abs: 2113 cells/uL (ref 850–3900)
MCH: 31.6 pg (ref 27.0–33.0)
MCHC: 34.7 g/dL (ref 32.0–36.0)
MCV: 91.1 fL (ref 80.0–100.0)
MPV: 13.4 fL — ABNORMAL HIGH (ref 7.5–12.5)
Monocytes Relative: 7.2 %
Neutro Abs: 3861 cells/uL (ref 1500–7800)
Neutrophils Relative %: 59.4 %
Platelets: 177 10*3/uL (ref 140–400)
RBC: 4.4 10*6/uL (ref 3.80–5.10)
RDW: 11.8 % (ref 11.0–15.0)
Total Lymphocyte: 32.5 %
WBC mixed population: 468 {cells}/uL (ref 200–950)
WBC: 6.5 10*3/uL (ref 3.8–10.8)

## 2018-08-18 LAB — CK: Total CK: 63 U/L (ref 29–143)

## 2018-08-18 LAB — COMPREHENSIVE METABOLIC PANEL WITH GFR
AST: 14 U/L (ref 10–30)
Alkaline phosphatase (APISO): 90 U/L (ref 33–115)
BUN: 9 mg/dL (ref 7–25)
Potassium: 3.6 mmol/L (ref 3.5–5.3)

## 2018-08-18 LAB — RHEUMATOID ARTHRITIS DIAGNOSTIC PANEL, COMPREHENSIVE
Cyclic Citrullin Peptide Ab: <16 Units (ref <20)
Rheumatoid Factor (IgA): <5 U (ref <=6)
Rheumatoid Factor (IgG): 20 U — ABNORMAL HIGH (ref <=6)
Rheumatoid Factor (IgM): 38 U — ABNORMAL HIGH (ref <=6)
SSA (Ro) (ENA) Antibody, IgG: <1 AI
SSB (La) (ENA) Antibody, IgG: <1 AI

## 2018-08-18 LAB — URIC ACID: Uric Acid, Serum: 4.2 mg/dL (ref 2.5–7.0)

## 2018-08-18 LAB — SEDIMENTATION RATE: Sed Rate: 11 mm/h (ref 0–20)

## 2018-08-18 LAB — HLA-B27 ANTIGEN: HLA-B27 Antigen: NEGATIVE

## 2018-08-26 DIAGNOSIS — G43709 Chronic migraine without aura, not intractable, without status migrainosus: Secondary | ICD-10-CM | POA: Diagnosis not present

## 2018-08-27 ENCOUNTER — Ambulatory Visit (INDEPENDENT_AMBULATORY_CARE_PROVIDER_SITE_OTHER): Payer: BLUE CROSS/BLUE SHIELD | Admitting: Sports Medicine

## 2018-08-27 ENCOUNTER — Encounter: Payer: Self-pay | Admitting: Sports Medicine

## 2018-08-27 DIAGNOSIS — E663 Overweight: Secondary | ICD-10-CM | POA: Diagnosis not present

## 2018-08-27 DIAGNOSIS — M79604 Pain in right leg: Secondary | ICD-10-CM

## 2018-08-27 DIAGNOSIS — M2550 Pain in unspecified joint: Secondary | ICD-10-CM

## 2018-08-27 DIAGNOSIS — G5603 Carpal tunnel syndrome, bilateral upper limbs: Secondary | ICD-10-CM

## 2018-08-27 MED ORDER — PHENTERMINE HCL 37.5 MG PO TABS: ORAL_TABLET | ORAL | 0 refills | Status: DC

## 2018-08-27 NOTE — Assessment & Plan Note (Signed)
Starting phentermine, return in 1 month. With BMI drops below 24 we can write a letter so that she can get the insurance discount.

## 2018-08-27 NOTE — Progress Notes (Signed)
Subjective:    CC: Follow-up  HPI: Polyarthralgia: Rheumatoid testing with a positive rheumatoid factor but negative CCP and other labs.  Prednisone helped significantly.  Right leg pain: Suspected L4 radiculitis, improved with prednisone.  X-rays did show degenerative changes.  Carpal tunnel syndrome: Improving after 2 weeks of night splinting.  I reviewed the past medical history, family history, social history, surgical history, and allergies today and no changes were needed.  Please see the problem list section below in epic for further details.  Past Medical History: Past Medical History:  Diagnosis Date  . Depression    Past Surgical History: Past Surgical History:  Procedure Laterality Date  . ABDOMINAL HYSTERECTOMY     Social History: Social History   Socioeconomic History  . Marital status: Married    Spouse name: Not on file  . Number of children: Not on file  . Years of education: Not on file  . Highest education level: Not on file  Occupational History  . Not on file  Social Needs  . Financial resource strain: Not on file  . Food insecurity:    Worry: Not on file    Inability: Not on file  . Transportation needs:    Medical: Not on file    Non-medical: Not on file  Tobacco Use  . Smoking status: Never Smoker  . Smokeless tobacco: Never Used  Substance and Sexual Activity  . Alcohol use: No  . Drug use: No  . Sexual activity: Not on file  Lifestyle  . Physical activity:    Days per week: Not on file    Minutes per session: Not on file  . Stress: Not on file  Relationships  . Social connections:    Talks on phone: Not on file    Gets together: Not on file    Attends religious service: Not on file    Active member of club or organization: Not on file    Attends meetings of clubs or organizations: Not on file    Relationship status: Not on file  Other Topics Concern  . Not on file  Social History Narrative  . Not on file   Family  History: Family History  Problem Relation Age of Onset  . Hypertension Mother   . Stroke Mother   . Diabetes Mother   . Alcohol abuse Mother   . Hyperlipidemia Father   . Diabetes Sister   . Cancer Maternal Grandmother        pancreatic  . Cancer Maternal Grandfather        lung   Allergies: No Known Allergies Medications: See med rec.  Review of Systems: No fevers, chills, night sweats, weight loss, chest pain, or shortness of breath.   Objective:    General: Well Developed, well nourished, and in no acute distress.  Neuro: Alert and oriented x3, extra-ocular muscles intact, sensation grossly intact.  HEENT: Normocephalic, atraumatic, pupils equal round reactive to light, neck supple, no masses, no lymphadenopathy, thyroid nonpalpable.  Skin: Warm and dry, no rashes. Cardiac: Regular rate and rhythm, no murmurs rubs or gallops, no lower extremity edema.  Respiratory: Clear to auscultation bilaterally. Not using accessory muscles, speaking in full sentences.  Impression and Recommendations:    Overweight (BMI 25.0-29.9) Starting phentermine, return in 1 month. With BMI drops below 24 we can write a letter so that she can get the insurance discount.  Carpal tunnel syndrome, bilateral Slight improvement with nighttime splinting, we do need to replace the right-sided wrist  brace. Continue this for 1 to 2 months before considering median nerve hydrodissection under ultrasound guidance.  Polyarthralgia Elevated rheumatoid factor but normal CCP, all of the rheumatoid testing is negative. This likely puts her in the gray area between normal and rheumatoid arthritis. Continue ibuprofen 800mg   3 times daily as needed, we will hold off on rheumatoid referral for now.  Right leg pain Likely L4 radiculitis, x-rays of the lumbar spine did show some degenerative changes at the L5-S1 level. Improved considerably with prednisone. I am going to add some specific home rehabilitation  exercises. ___________________________________________ Gwen Her. Dianah Field, M.D., ABFM., CAQSM. Primary Care and [place] Instructor of [place] of [facility] of [facility]

## 2018-08-27 NOTE — Assessment & Plan Note (Signed)
Likely L4 radiculitis, x-rays of the lumbar spine did show some degenerative changes at the L5-S1 level. Improved considerably with prednisone. I am going to add some specific home rehabilitation exercises.

## 2018-08-27 NOTE — Assessment & Plan Note (Signed)
Elevated rheumatoid factor but normal CCP, all of the rheumatoid testing is negative. This likely puts her in the gray area between normal and rheumatoid arthritis. Continue ibuprofen 800mg   3 times daily as needed, we will hold off on rheumatoid referral for now.

## 2018-08-27 NOTE — Assessment & Plan Note (Signed)
Slight improvement with nighttime splinting, we do need to replace the right-sided wrist brace. Continue this for 1 to 2 months before considering median nerve hydrodissection under ultrasound guidance.

## 2018-09-24 ENCOUNTER — Ambulatory Visit: Payer: BLUE CROSS/BLUE SHIELD | Admitting: Sports Medicine

## 2018-11-06 ENCOUNTER — Ambulatory Visit (INDEPENDENT_AMBULATORY_CARE_PROVIDER_SITE_OTHER): Payer: BLUE CROSS/BLUE SHIELD | Admitting: Osteopathic Medicine

## 2018-11-06 DIAGNOSIS — Z23 Encounter for immunization: Secondary | ICD-10-CM | POA: Diagnosis not present

## 2018-11-25 DIAGNOSIS — G43709 Chronic migraine without aura, not intractable, without status migrainosus: Secondary | ICD-10-CM | POA: Diagnosis not present

## 2019-04-28 DIAGNOSIS — Z1231 Encounter for screening mammogram for malignant neoplasm of breast: Secondary | ICD-10-CM | POA: Diagnosis not present

## 2019-05-07 DIAGNOSIS — R928 Other abnormal and inconclusive findings on diagnostic imaging of breast: Secondary | ICD-10-CM | POA: Diagnosis not present

## 2019-05-07 DIAGNOSIS — N6321 Unspecified lump in the left breast, upper outer quadrant: Secondary | ICD-10-CM | POA: Diagnosis not present

## 2019-05-15 DIAGNOSIS — R928 Other abnormal and inconclusive findings on diagnostic imaging of breast: Secondary | ICD-10-CM | POA: Diagnosis not present

## 2019-05-15 DIAGNOSIS — D242 Benign neoplasm of left breast: Secondary | ICD-10-CM | POA: Diagnosis not present

## 2019-05-15 DIAGNOSIS — R921 Mammographic calcification found on diagnostic imaging of breast: Secondary | ICD-10-CM | POA: Diagnosis not present

## 2019-05-15 DIAGNOSIS — N6321 Unspecified lump in the left breast, upper outer quadrant: Secondary | ICD-10-CM | POA: Diagnosis not present

## 2019-06-04 DIAGNOSIS — D242 Benign neoplasm of left breast: Secondary | ICD-10-CM | POA: Insufficient documentation

## 2019-07-21 ENCOUNTER — Telehealth (INDEPENDENT_AMBULATORY_CARE_PROVIDER_SITE_OTHER): Payer: BLUE CROSS/BLUE SHIELD | Admitting: Family Medicine

## 2019-07-21 ENCOUNTER — Encounter: Payer: Self-pay | Admitting: Family Medicine

## 2019-07-21 VITALS — BP 112/70 | HR 80 | Temp 98.3°F

## 2019-07-21 DIAGNOSIS — M79641 Pain in right hand: Secondary | ICD-10-CM | POA: Diagnosis not present

## 2019-07-21 DIAGNOSIS — S60511A Abrasion of right hand, initial encounter: Secondary | ICD-10-CM

## 2019-07-21 MED ORDER — SILVER SULFADIAZINE 1 % EX CREA: 1.0000 "application " | TOPICAL_CREAM | Freq: Every day | CUTANEOUS | 0 refills | Status: DC

## 2019-07-21 NOTE — Progress Notes (Signed)
Virtual Visit via Video Note  I connected with Deborah Schmitt on 07/21/19 at  1:20 PM EDT by a video enabled telemedicine application and verified that I am speaking with the correct person using two identifiers.   I discussed the limitations of evaluation and management by telemedicine and the availability of in person appointments. The patient expressed understanding and agreed to proceed.    Established Patient Office Visit  Subjective:  Patient ID: Deborah Schmitt, female    DOB: 05-Oct-1981  Age: 38 y.o. MRN: [id number]  CC:  Chief Complaint  Patient presents with  . Laceration    HPI Deborah Schmitt presents for Pt reports that she was running Friday morning and felt like her right knee just gave out on her.  She was actually going down a slope and then fell and scrapped her hands,arms,and knees.  She states that her R hand took the brunt of the fall and it was swollen. She has been using ice packs, neosporin twice a day is also been keeping it loosely wrapped.  And is been adding some cold packs just for comfort because it just burns.  She also initially noticed a little swelling of the wrist but says that has actually gotten a little bit better.. When she attempts to clean w/water it is very painful. can't put water on it.  She says the swelling in her wrist has gotten better.    Past Medical History:  Diagnosis Date  . Depression     Past Surgical History:  Procedure Laterality Date  . ABDOMINAL HYSTERECTOMY      Family History  Problem Relation Age of Onset  . Hypertension Mother   . Stroke Mother   . Diabetes Mother   . Alcohol abuse Mother   . Hyperlipidemia Father   . Diabetes Sister   . Cancer Maternal Grandmother        pancreatic  . Cancer Maternal Grandfather        lung    Social History   Socioeconomic History  . Marital status: Married    Spouse name: Not on file  . Number of children: Not on file  . Years of education: Not on file  .  Highest education level: Not on file  Occupational History  . Not on file  Social Needs  . Financial resource strain: Not on file  . Food insecurity    Worry: Not on file    Inability: Not on file  . Transportation needs    Medical: Not on file    Non-medical: Not on file  Tobacco Use  . Smoking status: Never Smoker  . Smokeless tobacco: Never Used  Substance and Sexual Activity  . Alcohol use: No  . Drug use: No  . Sexual activity: Not on file  Lifestyle  . Physical activity    Days per week: Not on file    Minutes per session: Not on file  . Stress: Not on file  Relationships  . Social Herbalist on phone: Not on file    Gets together: Not on file    Attends religious service: Not on file    Active member of club or organization: Not on file    Attends meetings of clubs or organizations: Not on file    Relationship status: Not on file  . Intimate partner violence    Fear of current or ex partner: Not on file    Emotionally abused: Not on file  Physically abused: Not on file    Forced sexual activity: Not on file  Other Topics Concern  . Not on file  Social History Narrative  . Not on file    Outpatient Medications Prior to Visit  Medication Sig Dispense Refill  . ipratropium (ATROVENT) 0.03 % nasal spray Place 2 sprays into both nostrils every 12 (twelve) hours. 30 mL 0  . verapamil (CALAN) 80 MG tablet Take 80 mg by mouth daily.   3  . phentermine (ADIPEX-P) 37.5 MG tablet One tab by mouth qAM 30 tablet 0   No facility-administered medications prior to visit.     No Known Allergies  ROS Review of Systems    Objective:    Physical Exam  Constitutional: She is oriented to person, place, and time. She appears well-developed and well-nourished.  HENT:  Head: Normocephalic.  Eyes: Conjunctivae and EOM are normal.  Pulmonary/Chest: Effort normal.  Neurological: She is alert and oriented to person, place, and time.  Skin: Skin is dry. No  pallor.  Large abrasion on the palm of her hand, very erythematous.  No streaking.    Psychiatric: She has a normal mood and affect. Her behavior is normal.  Vitals reviewed.   BP 112/70   Pulse 80   Temp 98.3 F (36.8 C)   LMP 04/17/2014  Wt Readings from Last 3 Encounters:  08/27/18 150 lb (68 kg)  08/13/18 149 lb (67.6 kg)  05/16/16 143 lb (64.9 kg)     Health Maintenance Due  Topic Date Due  . HIV Screening  12/07/1993  . PAP SMEAR-Modifier  02/12/2015  . INFLUENZA VACCINE  06/21/2019    There are no preventive care reminders to display for this patient.  Lab Results  Component Value Date   TSH 2.055 05/07/2014   Lab Results  Component Value Date   WBC 6.5 08/13/2018   HGB 13.9 08/13/2018   HCT 40.1 08/13/2018   MCV 91.1 08/13/2018   PLT 177 08/13/2018   Lab Results  Component Value Date   NA 139 08/13/2018   K 3.6 08/13/2018   CO2 27 08/13/2018   GLUCOSE 95 08/13/2018   BUN 9 08/13/2018   CREATININE 0.72 08/13/2018   BILITOT 0.5 08/13/2018   ALKPHOS 87 01/14/2010   AST 14 08/13/2018   ALT 11 08/13/2018   PROT 7.7 08/13/2018   ALBUMIN 4.7 01/14/2010   CALCIUM 9.6 08/13/2018   Lab Results  Component Value Date   CHOL 162 01/14/2010   Lab Results  Component Value Date   HDL 60 01/14/2010   Lab Results  Component Value Date   LDLCALC 87 01/14/2010   Lab Results  Component Value Date   TRIG 74 01/14/2010   Lab Results  Component Value Date   CHOLHDL 2.7 Ratio 01/14/2010   Lab Results  Component Value Date   HGBA1C 5.5 04/18/2013      Assessment & Plan:   Problem List Items Addressed This Visit    None    Visit Diagnoses    Abrasion of right hand, initial encounter    -  Primary   Relevant Medications   silver sulfADIAZINE (SILVADENE) 1 % cream   Right hand pain         Ok to stop the Neosporin.  Will change to Silvadene cream.  It looks like the whole top layer of skin is missing Sorg and treat this more as a burn.  Apply a  thick amount of the Silvadene cream and  then apply a very loose bandage.  Apply daily for the next 5 to 7 days and after that switch to plain Vaseline and then she no longer needs to keep it covered.  For the other wounds okay to just apply Vaseline twice a day.  Use antibacterial sterile soap.  Call if not improving.  Did warn her to look out for any erythematous streaking.  If she still continues to have some swelling and soreness in the wrist and please let us know we can always get an x-ray if needed.    Meds ordered this encounter  Medications  . silver sulfADIAZINE (SILVADENE) 1 % cream    Sig: Apply 1 application topically daily.    Dispense:  50 g    Refill:  0    Follow-up: Return if symptoms worsen or fail to improve.   I discussed the assessment and treatment plan with the patient. The patient was provided an opportunity to ask questions and all were answered. The patient agreed with the plan and demonstrated an understanding of the instructions.   The patient was advised to call back or seek an in-person evaluation if the symptoms worsen or if the condition fails to improve as anticipated.  Beatrice Lecher, MD

## 2019-07-21 NOTE — Progress Notes (Signed)
Pt reports that she was running Friday morning and fell she scrapped her hands,arms,and knees.  She states that her R hand took the brunt of the fall and it was swollen. She has been using ice packs, neosporin. When she attempts to clean w/water it is very painful.   Deborah Schmitt, [place] Cro[place], CMA

## 2019-09-08 DIAGNOSIS — H0011 Chalazion right upper eyelid: Secondary | ICD-10-CM | POA: Diagnosis not present

## 2019-10-03 DIAGNOSIS — G43709 Chronic migraine without aura, not intractable, without status migrainosus: Secondary | ICD-10-CM | POA: Diagnosis not present

## 2019-10-22 DIAGNOSIS — H0011 Chalazion right upper eyelid: Secondary | ICD-10-CM | POA: Diagnosis not present

## 2019-10-22 DIAGNOSIS — H0100B Unspecified blepharitis left eye, upper and lower eyelids: Secondary | ICD-10-CM | POA: Diagnosis not present

## 2019-10-22 DIAGNOSIS — H0100A Unspecified blepharitis right eye, upper and lower eyelids: Secondary | ICD-10-CM | POA: Diagnosis not present

## 2019-11-18 DIAGNOSIS — R921 Mammographic calcification found on diagnostic imaging of breast: Secondary | ICD-10-CM | POA: Diagnosis not present

## 2019-11-18 DIAGNOSIS — D242 Benign neoplasm of left breast: Secondary | ICD-10-CM | POA: Diagnosis not present

## 2019-11-23 IMAGING — DX DG KNEE COMPLETE 4+V*R*
4 series · 4 of 4 positions shown · non-contrast
Comparison: None.

CLINICAL DATA: Right knee pain for several years

EXAM:
RIGHT KNEE - COMPLETE 4+ VIEW

[knee lat]
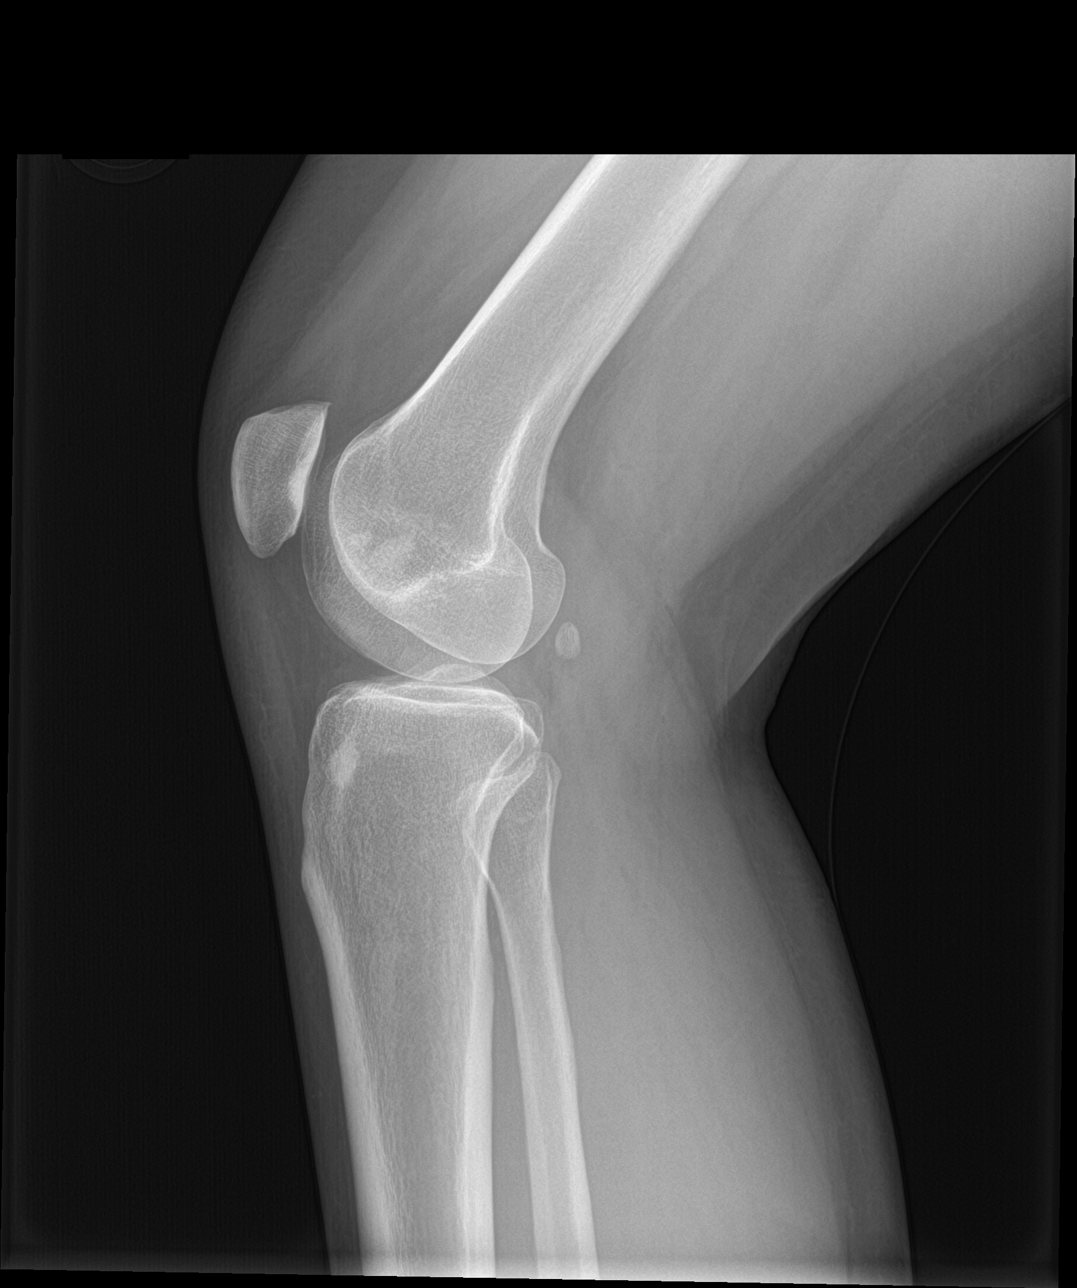

[knee sunrise]
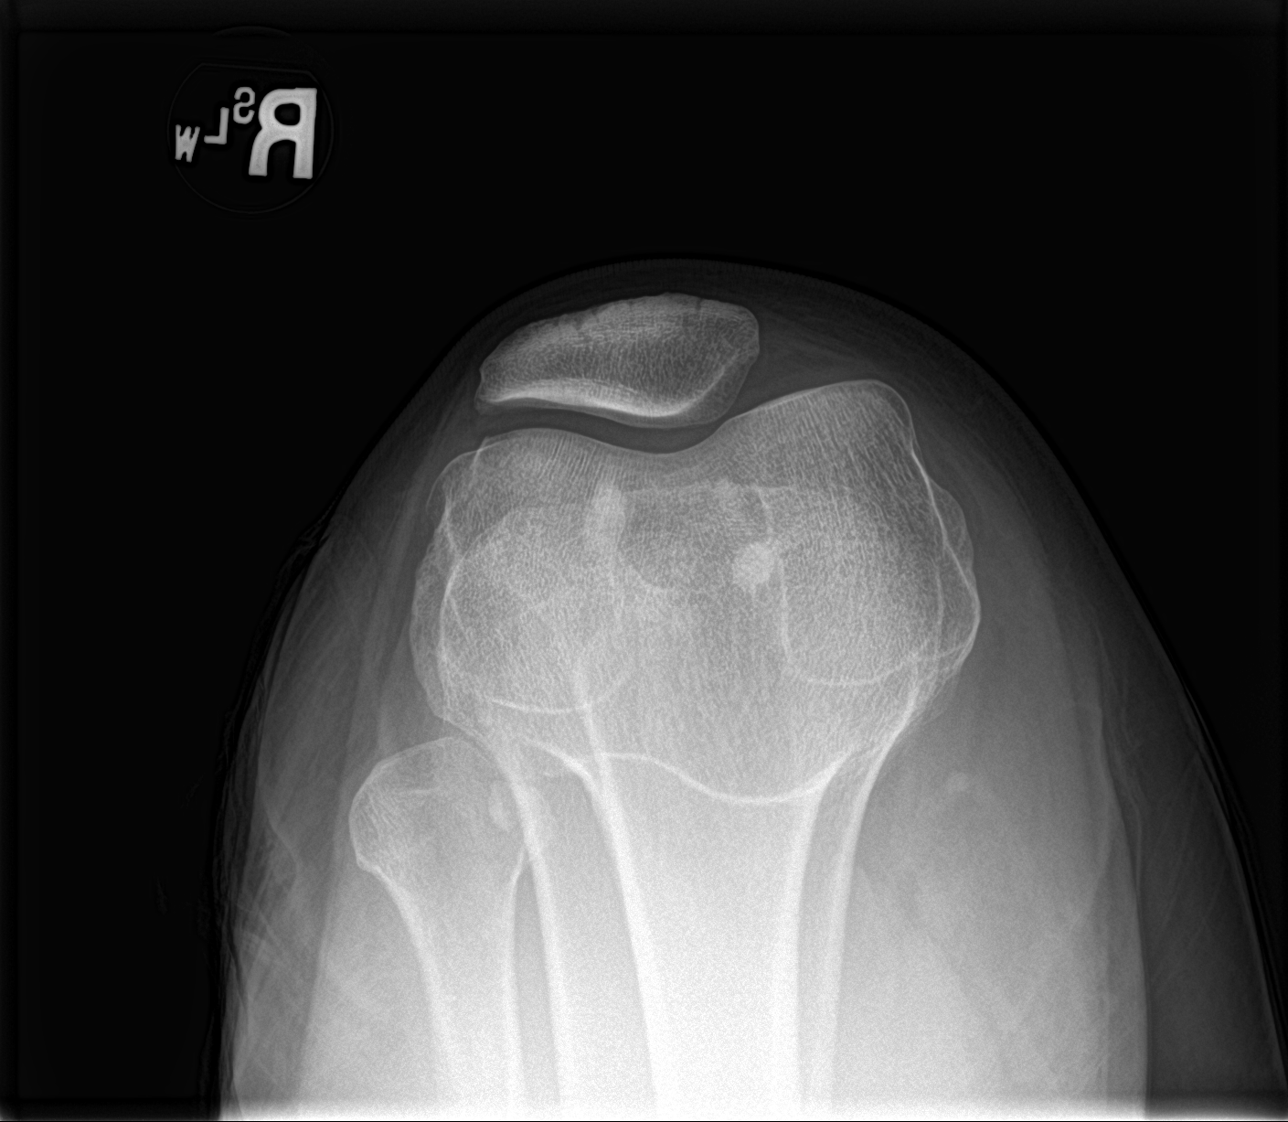

[knee ap bilat standing (1 of 2)]
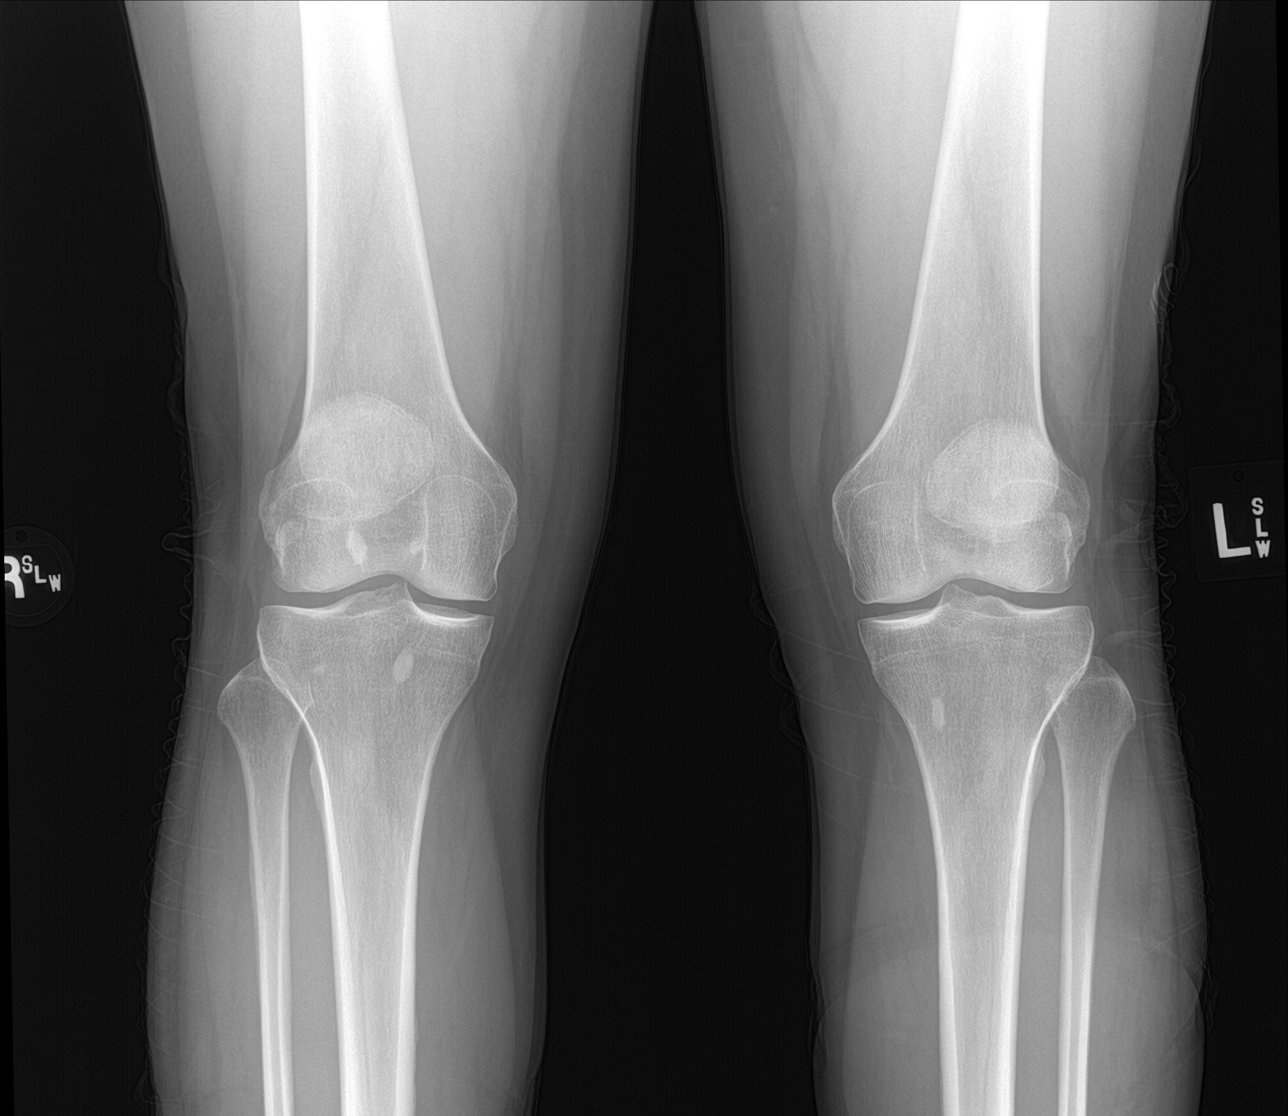

[knee ap bilat standing (2 of 2)]
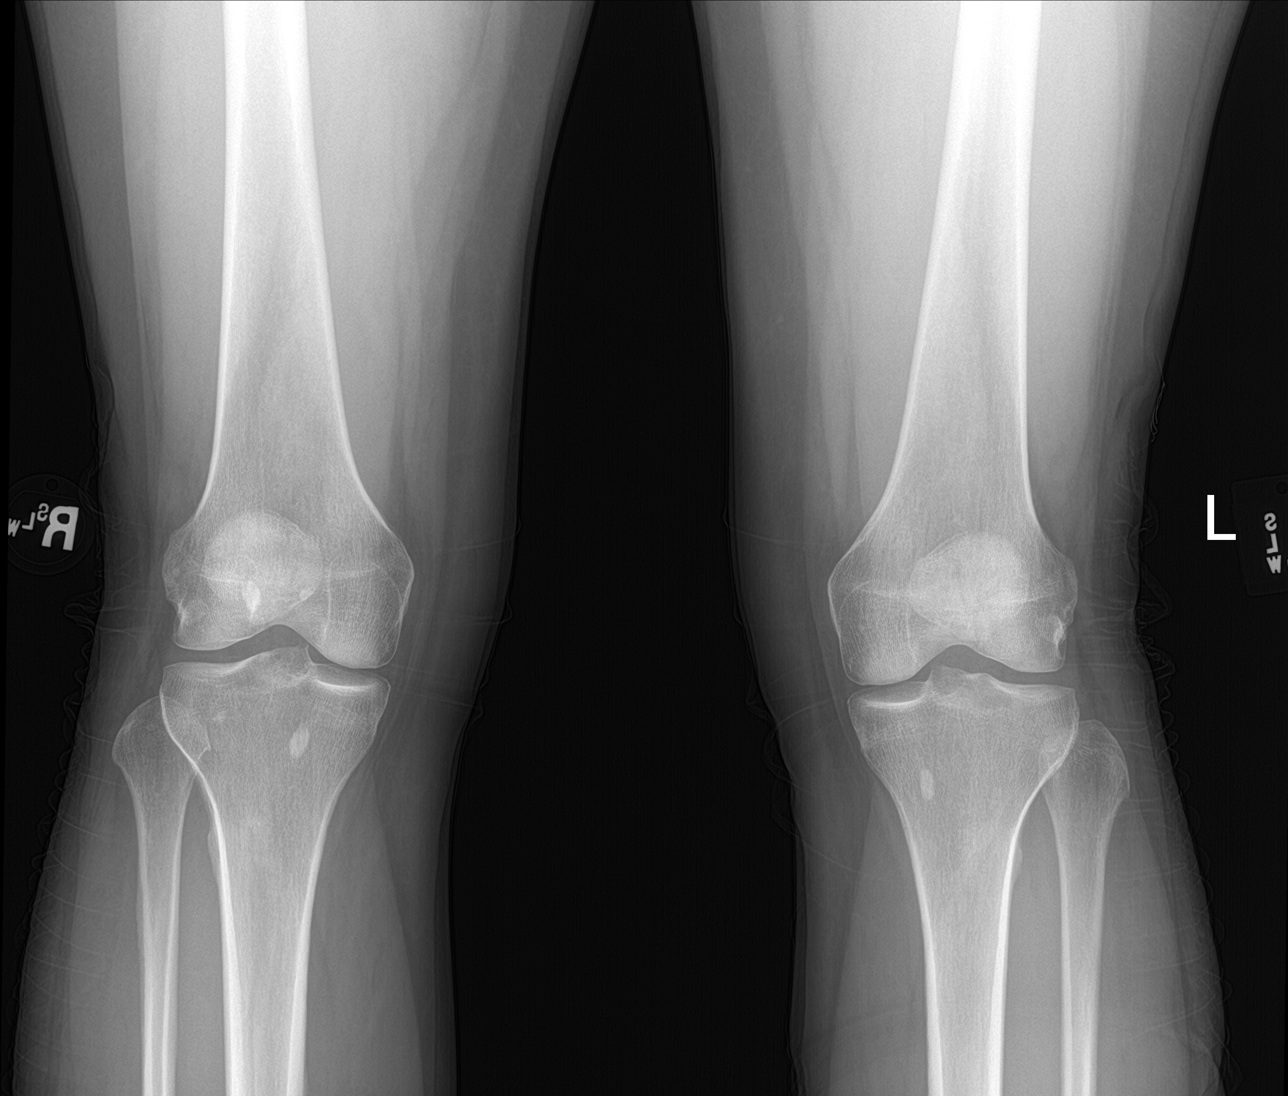

[4 of 4 positions shown; findings below may reference images not displayed]

FINDINGS: No significant joint effusion or joint space narrowing is noted.
Multiple bone islands are noted in the distal femur and proximal
tibia. No fracture or dislocation is seen. No soft tissue changes
are noted.
IMPRESSION: No acute abnormality seen.

## 2020-05-07 ENCOUNTER — Ambulatory Visit (INDEPENDENT_AMBULATORY_CARE_PROVIDER_SITE_OTHER): Payer: BC Managed Care – PPO | Admitting: Family Medicine

## 2020-05-07 ENCOUNTER — Encounter: Payer: Self-pay | Admitting: Family Medicine

## 2020-05-07 DIAGNOSIS — R21 Rash and other nonspecific skin eruption: Secondary | ICD-10-CM | POA: Diagnosis not present

## 2020-05-07 DIAGNOSIS — M549 Dorsalgia, unspecified: Secondary | ICD-10-CM

## 2020-05-07 MED ORDER — DOXYCYCLINE HYCLATE 100 MG PO TABS: 200.0000 mg | ORAL_TABLET | Freq: Once | ORAL | 0 refills | Status: AC

## 2020-05-07 MED ORDER — CYCLOBENZAPRINE HCL 10 MG PO TABS: 5.0000 mg | ORAL_TABLET | Freq: Every evening | ORAL | 0 refills | Status: AC | PRN

## 2020-05-07 NOTE — Patient Instructions (Addendum)
Take one Aleve in the morning and one in the evening for the next 4-5 days.   Use heating pad and or icy hot.  We can try the muscle relaxer at bedtime. Apply topical Lamisil cream to the rash twice a day. Please see stretches to do on her own at home for your back.

## 2020-05-07 NOTE — Progress Notes (Signed)
Acute Office Visit  Subjective:    Patient ID: Deborah Schmitt, female    DOB: 1980-11-28, 39 y.o.   MRN: [id number]  Chief Complaint  Patient presents with  . Rash    HPI Patient is in today for rash. Says had shaved last weekend and noticed a bump on her left posterior thigh.  She said within a day or 2 she woke up and started to notice that her back was actually hurting mostly on the right side.  It then progressed over the next couple days to hurting up into her neck and she noticed that the bump on her leg was getting more crusty and itchy.  She says she has been working out a lot and exercising but also has been doing a lot of walking around [facility] and was worried that something may have bit her she is particularly concerned about a tick though she did not see one.  Her daughter has Lyme's disease.  No fevers, chills etc.  She has been using IcyHot.  She has not tried heat or ice or anti-inflammatory.  Past Medical History:  Diagnosis Date  . Depression     Past Surgical History:  Procedure Laterality Date  . ABDOMINAL HYSTERECTOMY      Family History  Problem Relation Age of Onset  . Hypertension Mother   . Stroke Mother   . Diabetes Mother   . Alcohol abuse Mother   . Hyperlipidemia Father   . Diabetes Sister   . Cancer Maternal Grandmother        pancreatic  . Cancer Maternal Grandfather        lung    Social History   Socioeconomic History  . Marital status: Married    Spouse name: Not on file  . Number of children: Not on file  . Years of education: Not on file  . Highest education level: Not on file  Occupational History  . Not on file  Tobacco Use  . Smoking status: Never Smoker  . Smokeless tobacco: Never Used  Substance and Sexual Activity  . Alcohol use: No  . Drug use: No  . Sexual activity: Not on file  Other Topics Concern  . Not on file  Social History Narrative  . Not on file   Social Determinants of Health   Financial  Resource Strain:   . Difficulty of Paying Living Expenses:   Food Insecurity:   . Worried About Charity fundraiser in the Last Year:   . Arboriculturist in the Last Year:   Transportation Needs:   . Film/video editor (Medical):   Marland Kitchen Lack of Transportation (Non-Medical):   Physical Activity:   . Days of Exercise per Week:   . Minutes of Exercise per Session:   Stress:   . Feeling of Stress :   Social Connections:   . Frequency of Communication with Friends and Family:   . Frequency of Social Gatherings with Friends and Family:   . Attends Religious Services:   . Active Member of Clubs or Organizations:   . Attends Archivist Meetings:   Marland Kitchen Marital Status:   Intimate Partner Violence:   . Fear of Current or Ex-Partner:   . Emotionally Abused:   Marland Kitchen Physically Abused:   . Sexually Abused:     Outpatient Medications Prior to Visit  Medication Sig Dispense Refill  . fexofenadine (ALLEGRA ALLERGY) 180 MG tablet     . verapamil (CALAN) 80  MG tablet Take 80 mg by mouth daily.   3  . ipratropium (ATROVENT) 0.03 % nasal spray Place 2 sprays into both nostrils every 12 (twelve) hours. 30 mL 0  . silver sulfADIAZINE (SILVADENE) 1 % cream Apply 1 application topically daily. 50 g 0   No facility-administered medications prior to visit.    No Known Allergies  Review of Systems     Objective:    Physical Exam Vitals reviewed.  Constitutional:      Appearance: She is well-developed.  HENT:     Head: Normocephalic and atraumatic.  Eyes:     Conjunctiva/sclera: Conjunctivae normal.  Cardiovascular:     Rate and Rhythm: Normal rate.  Pulmonary:     Effort: Pulmonary effort is normal.  Musculoskeletal:     Comments: Normal cervical flexion, extension, rotation right and left, sidebending.  Shoulders with normal range of motion and strength.  Nontender over the cervical or thoracic spine and nontender over the paraspinous muscles.  Skin:    General: Skin is dry.      Coloration: Skin is not pale.     Comments: On posterior left thigh measures approximately 1.5 x 1.3 cm it is erythematous with some larger scale in the center and an erythematous ring around the edge.  Neurological:     Mental Status: She is alert and oriented to person, place, and time.  Psychiatric:        Behavior: Behavior normal.     LMP 04/17/2014  Wt Readings from Last 3 Encounters:  08/27/18 150 lb (68 kg)  08/13/18 149 lb (67.6 kg)  05/16/16 143 lb (64.9 kg)    Health Maintenance Due  Topic Date Due  . PAP SMEAR-Modifier  02/12/2015    There are no preventive care reminders to display for this patient.   Lab Results  Component Value Date   TSH 2.055 05/07/2014   Lab Results  Component Value Date   WBC 6.5 08/13/2018   HGB 13.9 08/13/2018   HCT 40.1 08/13/2018   MCV 91.1 08/13/2018   PLT 177 08/13/2018   Lab Results  Component Value Date   NA 139 08/13/2018   K 3.6 08/13/2018   CO2 27 08/13/2018   GLUCOSE 95 08/13/2018   BUN 9 08/13/2018   CREATININE 0.72 08/13/2018   BILITOT 0.5 08/13/2018   ALKPHOS 87 01/14/2010   AST 14 08/13/2018   ALT 11 08/13/2018   PROT 7.7 08/13/2018   ALBUMIN 4.7 01/14/2010   CALCIUM 9.6 08/13/2018   Lab Results  Component Value Date   CHOL 162 01/14/2010   Lab Results  Component Value Date   HDL 60 01/14/2010   Lab Results  Component Value Date   LDLCALC 87 01/14/2010   Lab Results  Component Value Date   TRIG 74 01/14/2010   Lab Results  Component Value Date   CHOLHDL 2.7 Ratio 01/14/2010   Lab Results  Component Value Date   HGBA1C 5.5 04/18/2013       Assessment & Plan:   Problem List Items Addressed This Visit    None    Visit Diagnoses    Rash    -  Primary   Relevant Orders   Fungal stain   Upper back pain on right side       Relevant Medications   cyclobenzaprine (FLEXERIL) 10 MG tablet     Rash-visually looks most consistent with ringworm.  Recommend treatment with topical  over-the-counter Lamisil cream twice a day.  Skin  scraping performed will call with results once available.  Right upper and mid back pain likely musculoskeletal recommend a trial of an anti-inflammatory particularly Aleve twice a day recommend heat and stretches handout provided to do her home PT.  We did discuss possible prophylaxis with a single dose of doxycycline though I really think she would have seen the tick if it had been attached and feeding long enough to actually cause Lyme's disease.  But will opt to treat prophylactically.  Call if symptoms worsen or change.  Meds ordered this encounter  Medications  . cyclobenzaprine (FLEXERIL) 10 MG tablet    Sig: Take 0.5-1 tablets (5-10 mg total) by mouth at bedtime as needed for muscle spasms.    Dispense:  20 tablet    Refill:  0  . doxycycline (VIBRA-TABS) 100 MG tablet    Sig: Take 2 tablets (200 mg total) by mouth once for 1 dose.    Dispense:  2 tablet    Refill:  0     Beatrice Lecher, MD

## 2020-05-10 LAB — FUNGAL STAIN
FUNGAL SMEAR:: NONE SEEN
MICRO NUMBER:: 10608267
SPECIMEN QUALITY:: ADEQUATE

## 2020-05-13 DIAGNOSIS — B354 Tinea corporis: Secondary | ICD-10-CM | POA: Diagnosis not present

## 2020-05-17 DIAGNOSIS — L0100 Impetigo, unspecified: Secondary | ICD-10-CM | POA: Diagnosis not present

## 2020-05-18 DIAGNOSIS — R928 Other abnormal and inconclusive findings on diagnostic imaging of breast: Secondary | ICD-10-CM | POA: Diagnosis not present

## 2020-05-18 DIAGNOSIS — D242 Benign neoplasm of left breast: Secondary | ICD-10-CM | POA: Diagnosis not present

## 2020-05-18 DIAGNOSIS — N6489 Other specified disorders of breast: Secondary | ICD-10-CM | POA: Diagnosis not present

## 2020-07-06 DIAGNOSIS — Z20822 Contact with and (suspected) exposure to covid-19: Secondary | ICD-10-CM | POA: Diagnosis not present

## 2020-07-22 DIAGNOSIS — H0011 Chalazion right upper eyelid: Secondary | ICD-10-CM | POA: Diagnosis not present

## 2020-07-28 ENCOUNTER — Telehealth: Payer: Self-pay | Admitting: Family Medicine

## 2020-07-28 ENCOUNTER — Encounter: Payer: Self-pay | Admitting: Family Medicine

## 2020-07-28 NOTE — Telephone Encounter (Signed)
Updated hysterectomy

## 2020-10-19 DIAGNOSIS — G43709 Chronic migraine without aura, not intractable, without status migrainosus: Secondary | ICD-10-CM | POA: Diagnosis not present

## 2020-11-04 DIAGNOSIS — E559 Vitamin D deficiency, unspecified: Secondary | ICD-10-CM | POA: Diagnosis not present

## 2020-11-04 DIAGNOSIS — R5383 Other fatigue: Secondary | ICD-10-CM | POA: Diagnosis not present

## 2020-11-04 DIAGNOSIS — Z8632 Personal history of gestational diabetes: Secondary | ICD-10-CM | POA: Diagnosis not present

## 2020-11-04 DIAGNOSIS — M2550 Pain in unspecified joint: Secondary | ICD-10-CM | POA: Diagnosis not present

## 2020-11-04 DIAGNOSIS — N951 Menopausal and female climacteric states: Secondary | ICD-10-CM | POA: Diagnosis not present

## 2020-11-04 DIAGNOSIS — K5909 Other constipation: Secondary | ICD-10-CM | POA: Diagnosis not present

## 2020-11-04 DIAGNOSIS — E538 Deficiency of other specified B group vitamins: Secondary | ICD-10-CM | POA: Diagnosis not present

## 2020-11-23 DIAGNOSIS — H00024 Hordeolum internum left upper eyelid: Secondary | ICD-10-CM | POA: Diagnosis not present

## 2020-12-10 DIAGNOSIS — K5909 Other constipation: Secondary | ICD-10-CM | POA: Diagnosis not present

## 2020-12-10 DIAGNOSIS — Z8632 Personal history of gestational diabetes: Secondary | ICD-10-CM | POA: Diagnosis not present

## 2020-12-10 DIAGNOSIS — R519 Headache, unspecified: Secondary | ICD-10-CM | POA: Diagnosis not present

## 2020-12-10 DIAGNOSIS — E60 Dietary zinc deficiency: Secondary | ICD-10-CM | POA: Diagnosis not present

## 2020-12-31 DIAGNOSIS — H0014 Chalazion left upper eyelid: Secondary | ICD-10-CM | POA: Diagnosis not present

## 2021-03-08 DIAGNOSIS — R519 Headache, unspecified: Secondary | ICD-10-CM | POA: Diagnosis not present

## 2021-03-08 DIAGNOSIS — K5909 Other constipation: Secondary | ICD-10-CM | POA: Diagnosis not present

## 2021-03-08 DIAGNOSIS — Z8632 Personal history of gestational diabetes: Secondary | ICD-10-CM | POA: Diagnosis not present

## 2021-03-08 DIAGNOSIS — E559 Vitamin D deficiency, unspecified: Secondary | ICD-10-CM | POA: Diagnosis not present

## 2021-03-08 DIAGNOSIS — N951 Menopausal and female climacteric states: Secondary | ICD-10-CM | POA: Diagnosis not present

## 2021-03-08 DIAGNOSIS — M2550 Pain in unspecified joint: Secondary | ICD-10-CM | POA: Diagnosis not present

## 2021-03-22 DIAGNOSIS — Z8632 Personal history of gestational diabetes: Secondary | ICD-10-CM | POA: Diagnosis not present

## 2021-03-22 DIAGNOSIS — K5909 Other constipation: Secondary | ICD-10-CM | POA: Diagnosis not present

## 2021-03-22 DIAGNOSIS — R519 Headache, unspecified: Secondary | ICD-10-CM | POA: Diagnosis not present

## 2021-03-22 DIAGNOSIS — E612 Magnesium deficiency: Secondary | ICD-10-CM | POA: Diagnosis not present

## 2021-05-19 DIAGNOSIS — Z1231 Encounter for screening mammogram for malignant neoplasm of breast: Secondary | ICD-10-CM | POA: Diagnosis not present

## 2021-05-19 LAB — HM MAMMOGRAPHY

## 2021-07-05 ENCOUNTER — Encounter: Payer: Self-pay | Admitting: Osteopathic Medicine

## 2021-07-05 ENCOUNTER — Ambulatory Visit (INDEPENDENT_AMBULATORY_CARE_PROVIDER_SITE_OTHER): Payer: BC Managed Care – PPO

## 2021-07-05 ENCOUNTER — Ambulatory Visit: Payer: BC Managed Care – PPO | Admitting: Family Medicine

## 2021-07-05 ENCOUNTER — Other Ambulatory Visit: Payer: Self-pay

## 2021-07-05 ENCOUNTER — Ambulatory Visit (INDEPENDENT_AMBULATORY_CARE_PROVIDER_SITE_OTHER): Payer: BC Managed Care – PPO | Admitting: Osteopathic Medicine

## 2021-07-05 VITALS — BP 120/69 | HR 68 | Temp 98.0°F | Wt 127.0 lb

## 2021-07-05 DIAGNOSIS — M79671 Pain in right foot: Secondary | ICD-10-CM

## 2021-07-05 DIAGNOSIS — M79674 Pain in right toe(s): Secondary | ICD-10-CM | POA: Diagnosis not present

## 2021-07-05 DIAGNOSIS — R200 Anesthesia of skin: Secondary | ICD-10-CM | POA: Diagnosis not present

## 2021-07-05 NOTE — Progress Notes (Signed)
Deborah Schmitt is a 40 y.o. female who presents to  [place] [facility]  today, 07/05/21, seeking care for the following:  Concern for right foot discomfort/numbness/tingling on dorsal aspect of foot overlying metatarsal phalangeal joints medially, reports does not seem to vary despite wearing a couple different kinds of shoes, patient exercises/runs, notes that it is a bit worse after this.  Concerned about a possible bunion formation but on exam minimal/no evidence of this.  Normal sensation on exam, no obvious bony deformity, normal strength in digits.     ASSESSMENT & PLAN with other pertinent findings:  The encounter diagnosis was Right foot discomfort / tingling . No obvious etiology based on exam, will refer to podiatry, may need orthotics vs immobilization     Patient Instructions  Xray today  Podiatry referral   Orders Placed This Encounter  Procedures   DG Foot Complete Right   Ambulatory referral to Podiatry    No orders of the defined types were placed in this encounter.    See below for relevant physical exam findings  See below for recent lab and imaging results reviewed  Medications, allergies, PMH, PSH, SocH, [place] reviewed below    Follow-up instructions: Return if symptoms worsen or fail to improve / pending podiatry consult.                                        Exam:  BP 120/69 (BP Location: Left Arm, Patient Position: Sitting, Cuff Size: Normal)   Pulse 68   Temp 98 F (36.7 C) (Oral)   Wt 127 lb (57.6 kg)   LMP 04/17/2014   BMI 21.80 kg/m  Constitutional: VS see above. General Appearance: alert, well-developed, well-nourished, NAD Neck: No masses, trachea midline.  Respiratory: Normal respiratory effort.  Musculoskeletal: Gait normal. Symmetric and independent movement of all extremities Neurological: Normal balance/coordination. No tremor. Skin: warm, dry,  intact.  Psychiatric: Normal judgment/insight. Normal mood and affect. Oriented x3.   Current Meds  Medication Sig   fexofenadine (ALLEGRA) 180 MG tablet     No Known Allergies  Patient Active Problem List   Diagnosis Date Noted   Overweight (BMI 25.0-29.9) 08/27/2018   Carpal tunnel syndrome, bilateral 08/13/2018   Polyarthralgia 08/13/2018   Right leg pain 08/13/2018   ANXIETY, SITUATIONAL 01/14/2010   LOSS, HEARING NOS 08/12/2007    Family History  Problem Relation Age of Onset   Hypertension Mother    Stroke Mother    Diabetes Mother    Alcohol abuse Mother    Hyperlipidemia Father    Diabetes Sister    Cancer Maternal Grandmother        pancreatic   Cancer Maternal Grandfather        lung    Social History   Tobacco Use  Smoking Status Never  Smokeless Tobacco Never    Past Surgical History:  Procedure Laterality Date   TOTAL VAGINAL HYSTERECTOMY  11/28/2016    Immunization History  Administered Date(s) Administered   Influenza Inj Mdck Quad Pf 01/02/2018   Influenza Whole 10/01/2007, 08/07/2011   Influenza,inj,Quad PF,6+ Mos 12/18/2016, 11/06/2018   Td 11/20/1997   Tdap 03/05/2012    Recent Results (from the past 2160 hour(s))  [facility] MAMMOGRAPHY     Status: None   Collection Time: 05/19/21 12:00 AM  Result Value Ref Range   [facility] Mammogram  0-4 Bi-Rad 0-4 Bi-Rad, Self Reported Normal    No results found.     All questions at time of visit were answered - patient instructed to contact office with any additional concerns or updates. ER/RTC precautions were reviewed with the patient as applicable.   Please note: manual typing as well as voice recognition software may have been used to produce this document - typos may escape review. Please contact Dr. Sheppard Coil for any needed clarifications.

## 2021-07-05 NOTE — Patient Instructions (Signed)
Xray today  Podiatry referral

## 2021-07-12 ENCOUNTER — Encounter: Payer: Self-pay | Admitting: Podiatry

## 2021-07-12 ENCOUNTER — Ambulatory Visit (INDEPENDENT_AMBULATORY_CARE_PROVIDER_SITE_OTHER): Payer: BC Managed Care – PPO | Admitting: Podiatry

## 2021-07-12 ENCOUNTER — Other Ambulatory Visit: Payer: Self-pay

## 2021-07-12 DIAGNOSIS — M792 Neuralgia and neuritis, unspecified: Secondary | ICD-10-CM

## 2021-07-12 DIAGNOSIS — M21619 Bunion of unspecified foot: Secondary | ICD-10-CM

## 2021-07-12 NOTE — Patient Instructions (Signed)

## 2021-07-18 DIAGNOSIS — R0789 Other chest pain: Secondary | ICD-10-CM | POA: Diagnosis not present

## 2021-07-18 DIAGNOSIS — R002 Palpitations: Secondary | ICD-10-CM | POA: Diagnosis not present

## 2021-07-18 DIAGNOSIS — K219 Gastro-esophageal reflux disease without esophagitis: Secondary | ICD-10-CM | POA: Diagnosis not present

## 2021-07-18 DIAGNOSIS — R000 Tachycardia, unspecified: Secondary | ICD-10-CM | POA: Diagnosis not present

## 2021-07-18 DIAGNOSIS — R9431 Abnormal electrocardiogram [ECG] [EKG]: Secondary | ICD-10-CM | POA: Diagnosis not present

## 2021-07-18 DIAGNOSIS — R079 Chest pain, unspecified: Secondary | ICD-10-CM | POA: Diagnosis not present

## 2021-07-18 DIAGNOSIS — R7989 Other specified abnormal findings of blood chemistry: Secondary | ICD-10-CM | POA: Diagnosis not present

## 2021-07-18 DIAGNOSIS — R072 Precordial pain: Secondary | ICD-10-CM | POA: Diagnosis not present

## 2021-07-19 NOTE — Progress Notes (Signed)
Subjective:   Patient ID: Deborah Schmitt, female   DOB: 40 y.o.   MRN: [id number]   HPI 40 year old female presents the office today for concerns of right foot pain, swelling.  She states that she did have some numbness to the big toe and felt tight and uncomfortable.  She denies any recent injury or trauma.  She currently has no numbness or tingling.  No burning or throbbing.  Symptoms have improved some.  Had a recent x-ray performed.   Review of Systems  All other systems reviewed and are negative.  Past Medical History:  Diagnosis Date   Depression     Past Surgical History:  Procedure Laterality Date   TOTAL VAGINAL HYSTERECTOMY  11/28/2016     Current Outpatient Medications:    rizatriptan (MAXALT) 10 MG tablet, Take by mouth., Disp: , Rfl:    cyclobenzaprine (FLEXERIL) 10 MG tablet, Take 0.5-1 tablets (5-10 mg total) by mouth at bedtime as needed for muscle spasms. (Patient not taking: Reported on 07/05/2021), Disp: 20 tablet, Rfl: 0   fexofenadine (ALLEGRA) 180 MG tablet, , Disp: , Rfl:    fexofenadine (ALLEGRA) 180 MG tablet, daily., Disp: , Rfl:    verapamil (CALAN) 80 MG tablet, Take 80 mg by mouth daily.  (Patient not taking: Reported on 07/05/2021), Disp: , Rfl: 3  No Known Allergies        Objective:  Physical Exam  General: AAO x3, NAD  Dermatological: Skin is warm, dry and supple bilateral.  There are no open sores, no preulcerative lesions, no rash or signs of infection present.  Vascular: Dorsalis Pedis artery and Posterior Tibial artery pedal pulses are 2/4 bilateral with immedate capillary fill time. There is no pain with calf compression, swelling, warmth, erythema.   Neruologic: Grossly intact via light touch bilateral.  Sensation intact with Semmes Weinstein monofilament.  Musculoskeletal: Clinically mild bunion is present.  At this time there is no specific area pinpoint tenderness.  Sensation appears to be intact.  No crepitation with MPJ or IPJ  range of motion.  No significant discomfort.  Muscular strength 5/5 in all groups tested bilateral.  Gait: Unassisted, Nonantalgic.       Assessment:   40 year old female with likely neuritis due to bunion     Plan:  -Treatment options discussed including all alternatives, risks, and complications -Etiology of symptoms were discussed -Symptoms seem to be improving.-I reviewed the x-rays there were nonweightbearing x-rays.  Clinically she has a bunion I think the pressure could have caused some neuritis into her toe.  We discussed shoe modifications, offloading and padding to monitor for any reoccurrence.  Trula Slade DPM

## 2021-07-21 DIAGNOSIS — R072 Precordial pain: Secondary | ICD-10-CM | POA: Diagnosis not present

## 2021-08-11 DIAGNOSIS — R072 Precordial pain: Secondary | ICD-10-CM | POA: Diagnosis not present

## 2021-08-22 DIAGNOSIS — Z6821 Body mass index (BMI) 21.0-21.9, adult: Secondary | ICD-10-CM | POA: Diagnosis not present

## 2021-08-22 DIAGNOSIS — Z01419 Encounter for gynecological examination (general) (routine) without abnormal findings: Secondary | ICD-10-CM | POA: Diagnosis not present

## 2021-08-22 DIAGNOSIS — Z90710 Acquired absence of both cervix and uterus: Secondary | ICD-10-CM | POA: Diagnosis not present

## 2021-09-06 DIAGNOSIS — E559 Vitamin D deficiency, unspecified: Secondary | ICD-10-CM | POA: Diagnosis not present

## 2021-09-06 DIAGNOSIS — Z8632 Personal history of gestational diabetes: Secondary | ICD-10-CM | POA: Diagnosis not present

## 2021-09-06 DIAGNOSIS — E60 Dietary zinc deficiency: Secondary | ICD-10-CM | POA: Diagnosis not present

## 2021-09-06 DIAGNOSIS — R519 Headache, unspecified: Secondary | ICD-10-CM | POA: Diagnosis not present

## 2021-09-06 DIAGNOSIS — E538 Deficiency of other specified B group vitamins: Secondary | ICD-10-CM | POA: Diagnosis not present

## 2021-09-06 DIAGNOSIS — K5909 Other constipation: Secondary | ICD-10-CM | POA: Diagnosis not present

## 2021-09-11 DIAGNOSIS — R072 Precordial pain: Secondary | ICD-10-CM | POA: Diagnosis not present

## 2021-09-20 DIAGNOSIS — R7303 Prediabetes: Secondary | ICD-10-CM | POA: Diagnosis not present

## 2021-09-20 DIAGNOSIS — Z87898 Personal history of other specified conditions: Secondary | ICD-10-CM | POA: Diagnosis not present

## 2021-09-20 DIAGNOSIS — B2790 Infectious mononucleosis, unspecified without complication: Secondary | ICD-10-CM | POA: Diagnosis not present

## 2021-09-20 DIAGNOSIS — Z1329 Encounter for screening for other suspected endocrine disorder: Secondary | ICD-10-CM | POA: Diagnosis not present

## 2021-11-01 DIAGNOSIS — R7303 Prediabetes: Secondary | ICD-10-CM | POA: Diagnosis not present

## 2021-11-01 DIAGNOSIS — H0019 Chalazion unspecified eye, unspecified eyelid: Secondary | ICD-10-CM | POA: Diagnosis not present

## 2021-11-01 DIAGNOSIS — B2790 Infectious mononucleosis, unspecified without complication: Secondary | ICD-10-CM | POA: Diagnosis not present

## 2022-06-30 LAB — HM MAMMOGRAPHY

## 2022-08-05 ENCOUNTER — Encounter: Payer: Self-pay | Admitting: Family Medicine

## 2022-10-15 IMAGING — DX DG FOOT COMPLETE 3+V*R*
3 series · 3 of 3 positions shown · non-contrast
Comparison: None.

CLINICAL DATA: Right toe numbness and pain for 1 week, no known
injury, initial encounter

EXAM:
RIGHT FOOT COMPLETE - 3+ VIEW

[foot ap]
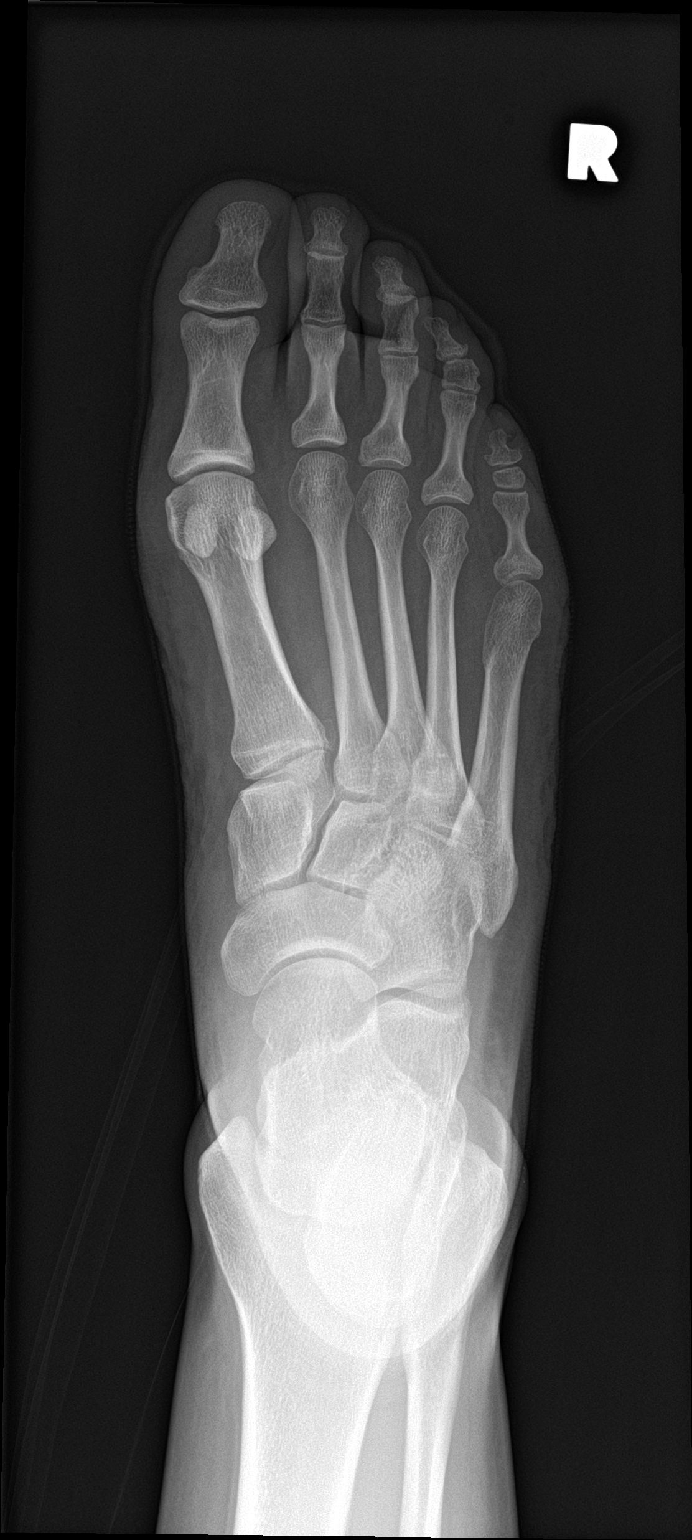

[foot obl]
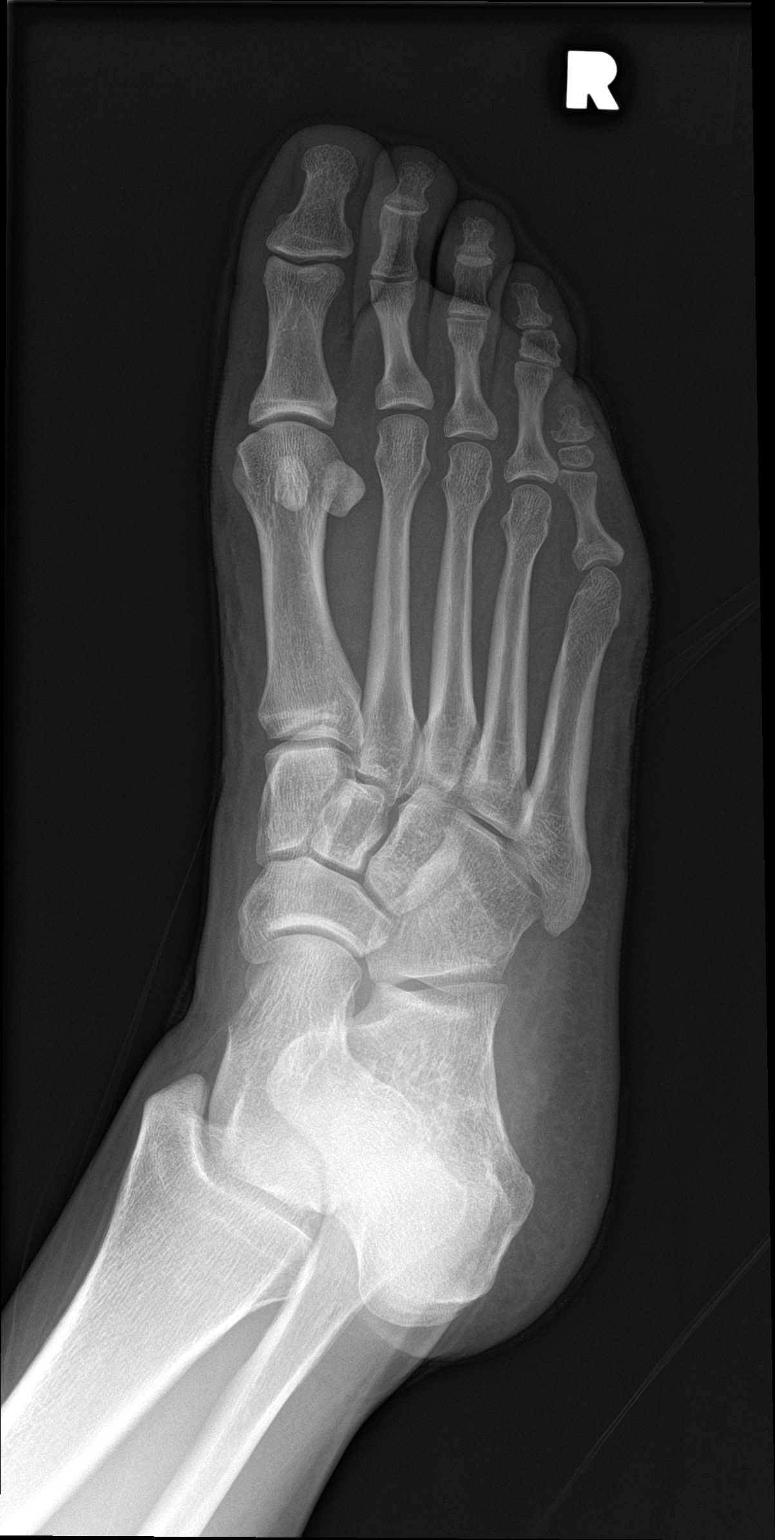

[foot lat]
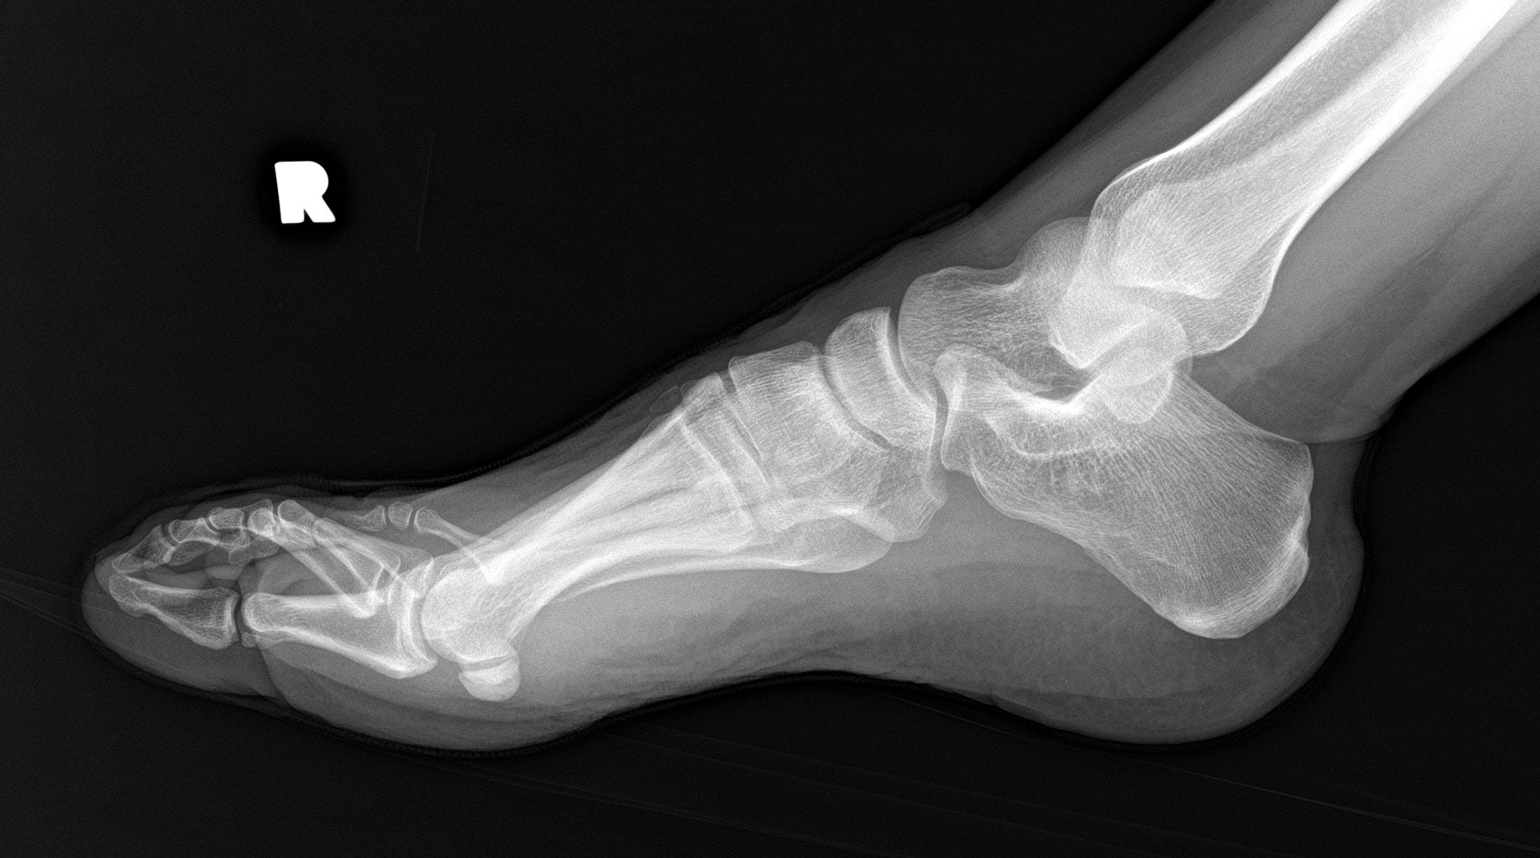

[3 of 3 positions shown; findings below may reference images not displayed]

FINDINGS: There is no evidence of fracture or dislocation. There is no
evidence of arthropathy or other focal bone abnormality. Soft
tissues are unremarkable.
IMPRESSION: No acute abnormality noted.

## 2024-06-06 ENCOUNTER — Encounter: Payer: Self-pay | Admitting: Advanced Practice Midwife
